# Patient Record
Sex: Female | Born: 1990 | Race: Black or African American | Hispanic: No | Marital: Single | Smoking: Former smoker
Health system: Southern US, Community
[De-identification: ages and names within clinical notes are randomized; demographics above are authoritative.]

## PROBLEM LIST (undated history)

## (undated) DIAGNOSIS — Z789 Other specified health status: Secondary | ICD-10-CM

## (undated) HISTORY — PX: NO PAST SURGERIES: SHX2092

---
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
## 2012-08-18 ENCOUNTER — Telehealth: Payer: Self-pay | Admitting: *Deleted

## 2012-08-18 NOTE — Telephone Encounter (Signed)
MFM in [place] appt 08/31/12 @ 7A.

## 2012-08-26 ENCOUNTER — Encounter: Admitting: Advanced Practice Midwife

## 2012-08-26 ENCOUNTER — Encounter: Admitting: Obstetrics & Gynecology

## 2013-03-25 DIAGNOSIS — Z113 Encounter for screening for infections with a predominantly sexual mode of transmission: Secondary | ICD-10-CM | POA: Insufficient documentation

## 2013-03-25 DIAGNOSIS — Z309 Encounter for contraceptive management, unspecified: Secondary | ICD-10-CM | POA: Insufficient documentation

## 2013-08-22 ENCOUNTER — Emergency Department (HOSPITAL_BASED_OUTPATIENT_CLINIC_OR_DEPARTMENT_OTHER)
Admission: EM | Admit: 2013-08-22 | Discharge: 2013-08-22 | Disposition: A | Discharge status: Home / Self Care | Attending: Emergency Medicine | Admitting: Emergency Medicine

## 2013-08-22 ENCOUNTER — Encounter (HOSPITAL_BASED_OUTPATIENT_CLINIC_OR_DEPARTMENT_OTHER): Payer: Self-pay | Admitting: Emergency Medicine

## 2013-08-22 DIAGNOSIS — F17200 Nicotine dependence, unspecified, uncomplicated: Secondary | ICD-10-CM | POA: Insufficient documentation

## 2013-08-22 DIAGNOSIS — J329 Chronic sinusitis, unspecified: Secondary | ICD-10-CM

## 2013-08-22 DIAGNOSIS — J069 Acute upper respiratory infection, unspecified: Secondary | ICD-10-CM | POA: Insufficient documentation

## 2013-08-22 DIAGNOSIS — IMO0001 Reserved for inherently not codable concepts without codable children: Secondary | ICD-10-CM | POA: Insufficient documentation

## 2013-08-22 DIAGNOSIS — Z79899 Other long term (current) drug therapy: Secondary | ICD-10-CM | POA: Insufficient documentation

## 2013-08-22 MED ORDER — IBUPROFEN 400 MG PO TABS: 400.0000 mg | ORAL_TABLET | Freq: Four times a day (QID) | ORAL | Status: DC | PRN

## 2013-08-22 MED ORDER — AMOXICILLIN 500 MG PO CAPS: 500.0000 mg | ORAL_CAPSULE | Freq: Three times a day (TID) | ORAL | Status: DC

## 2013-08-22 MED ORDER — OXYMETAZOLINE HCL 0.05 % NA SOLN: 1.0000 | Freq: Two times a day (BID) | NASAL | Status: DC

## 2013-08-22 MED ORDER — SALINE SPRAY 0.65 % NA SOLN: 1.0000 | NASAL | Status: DC | PRN

## 2013-08-22 NOTE — Discharge Instructions (Signed)
We saw you in the ER for the cough and body aches. We think what you have is a viral syndrome - the treatment for which is symptomatic relief only, and your body will fight the infection off in a few days. We are prescribing you some meds for pain and other symptoms.   Cool Mist Vaporizers Vaporizers may help relieve the symptoms of a cough and cold. They add moisture to the air, which helps mucus to become thinner and less sticky. This makes it easier to breathe and cough up secretions. Cool mist vaporizers do not cause serious burns like hot mist vaporizers ("steamers, humidifiers"). Vaporizers have not been proved to show they help with colds. You should not use a vaporizer if you are allergic to mold.  HOME CARE INSTRUCTIONS  Follow the package instructions for the vaporizer.  Do not use anything other than distilled water in the vaporizer.  Do not run the vaporizer all of the time. This can cause mold or bacteria to grow in the vaporizer.  Clean the vaporizer after each time it is used.  Clean and dry the vaporizer well before storing it.  Stop using the vaporizer if worsening respiratory symptoms develop. Document Released: 11/30/2003 Document Revised: 11/04/2012 Document Reviewed: 07/22/2012 [facility] Patient Information 2014 [place], [place].  Viral Infections A viral infection can be caused by different types of viruses.Most viral infections are not serious and resolve on their own. However, some infections may cause severe symptoms and may lead to further complications. SYMPTOMS Viruses can frequently cause:  Minor sore throat.  Aches and pains.  Headaches.  Runny nose.  Different types of rashes.  Watery eyes.  Tiredness.  Cough.  Loss of appetite.  Gastrointestinal infections, resulting in nausea, vomiting, and diarrhea. These symptoms do not respond to antibiotics because the infection is not caused by bacteria. However, you might catch a bacterial infection  following the viral infection. This is sometimes called a "superinfection." Symptoms of such a bacterial infection may include:  Worsening sore throat with pus and difficulty swallowing.  Swollen neck glands.  Chills and a high or persistent fever.  Severe headache.  Tenderness over the sinuses.  Persistent overall ill feeling (malaise), muscle aches, and tiredness (fatigue).  Persistent cough.  Yellow, green, or brown mucus production with coughing. HOME CARE INSTRUCTIONS   Only take over-the-counter or prescription medicines for pain, discomfort, diarrhea, or fever as directed by your caregiver.  Drink enough water and fluids to keep your urine clear or pale yellow. Sports drinks can provide valuable electrolytes, sugars, and hydration.  Get plenty of rest and maintain proper nutrition. Soups and broths with crackers or rice are fine. SEEK IMMEDIATE MEDICAL CARE IF:   You have severe headaches, shortness of breath, chest pain, neck pain, or an unusual rash.  You have uncontrolled vomiting, diarrhea, or you are unable to keep down fluids.  You or your child has an oral temperature above 102 F (38.9 C), not controlled by medicine.  Your baby is older than 3 months with a rectal temperature of 102 F (38.9 C) or higher.  Your baby is 38 months old or younger with a rectal temperature of 100.4 F (38 C) or higher. MAKE SURE YOU:   Understand these instructions.  Will watch your condition.  Will get help right away if you are not doing well or get worse. Document Released: 12/12/2004 Document Revised: 05/27/2011 Document Reviewed: 07/09/2010 [facility] Patient Information 2014 [place], [place]. Sinusitis Sinusitis is redness, soreness, and swelling (  inflammation) of the paranasal sinuses. Paranasal sinuses are air pockets within the bones of your face (beneath the eyes, the middle of the forehead, or above the eyes). In healthy paranasal sinuses, mucus is able to drain out,  and air is able to circulate through them by way of your nose. However, when your paranasal sinuses are inflamed, mucus and air can become trapped. This can allow bacteria and other germs to grow and cause infection. Sinusitis can develop quickly and last only a short time (acute) or continue over a long period (chronic). Sinusitis that lasts for more than 12 weeks is considered chronic.  CAUSES  Causes of sinusitis include:  Allergies.  Structural abnormalities, such as displacement of the cartilage that separates your nostrils (deviated septum), which can decrease the air flow through your nose and sinuses and affect sinus drainage.  Functional abnormalities, such as when the small hairs (cilia) that line your sinuses and help remove mucus do not work properly or are not present. SYMPTOMS  Symptoms of acute and chronic sinusitis are the same. The primary symptoms are pain and pressure around the affected sinuses. Other symptoms include:  Upper toothache.  Earache.  Headache.  Bad breath.  Decreased sense of smell and taste.  A cough, which worsens when you are lying flat.  Fatigue.  Fever.  Thick drainage from your nose, which often is green and may contain pus (purulent).  Swelling and warmth over the affected sinuses. DIAGNOSIS  Your caregiver will perform a physical exam. During the exam, your caregiver may:  Look in your nose for signs of abnormal growths in your nostrils (nasal polyps).  Tap over the affected sinus to check for signs of infection.  View the inside of your sinuses (endoscopy) with a special imaging device with a light attached (endoscope), which is inserted into your sinuses. If your caregiver suspects that you have chronic sinusitis, one or more of the following tests may be recommended:  Allergy tests.  Nasal culture A sample of mucus is taken from your nose and sent to a lab and screened for bacteria.  Nasal cytology A sample of mucus is taken  from your nose and examined by your caregiver to determine if your sinusitis is related to an allergy. TREATMENT  Most cases of acute sinusitis are related to a viral infection and will resolve on their own within 10 days. Sometimes medicines are prescribed to help relieve symptoms (pain medicine, decongestants, nasal steroid sprays, or saline sprays).  However, for sinusitis related to a bacterial infection, your caregiver will prescribe antibiotic medicines. These are medicines that will help kill the bacteria causing the infection.  Rarely, sinusitis is caused by a fungal infection. In theses cases, your caregiver will prescribe antifungal medicine. For some cases of chronic sinusitis, surgery is needed. Generally, these are cases in which sinusitis recurs more than 3 times per year, despite other treatments. HOME CARE INSTRUCTIONS   Drink plenty of water. Water helps thin the mucus so your sinuses can drain more easily.  Use a humidifier.  Inhale steam 3 to 4 times a day (for example, sit in the bathroom with the shower running).  Apply a warm, moist washcloth to your face 3 to 4 times a day, or as directed by your caregiver.  Use saline nasal sprays to help moisten and clean your sinuses.  Take over-the-counter or prescription medicines for pain, discomfort, or fever only as directed by your caregiver. SEEK IMMEDIATE MEDICAL CARE IF:  You have increasing  pain or severe headaches.  You have nausea, vomiting, or drowsiness.  You have swelling around your face.  You have vision problems.  You have a stiff neck.  You have difficulty breathing. MAKE SURE YOU:   Understand these instructions.  Will watch your condition.  Will get help right away if you are not doing well or get worse. Document Released: 03/04/2005 Document Revised: 05/27/2011 Document Reviewed: 03/19/2011 [facility] Patient Information 2014 [place], [place].

## 2013-08-22 NOTE — ED Provider Notes (Signed)
CSN: [id number]     Arrival date & time 08/22/13  1155 History   First MD Initiated Contact with Patient 08/22/13 1254     Chief Complaint  Patient presents with  . Cough     (Consider location/radiation/quality/duration/timing/severity/associated sxs/prior Treatment) HPI Comments: Pt isd having some uri lie sx with congestion, cough and sinus pain. Pt also has body aches. Symptoms started 1 week ago. Cough is producing mucus - yellowish.  No wheezing, no sick contacts. + chills x 2 days. Pt is using OTC meds for congestion.  Patient is a 23 y.o. female presenting with cough. The history is provided by the patient.  Cough Associated symptoms: chills, myalgias and rhinorrhea   Associated symptoms: no chest pain, no fever, no headaches, no shortness of breath, no sore throat and no wheezing     History reviewed. No pertinent past medical history. History reviewed. No pertinent past surgical history. No family history on file. History  Substance Use Topics  . Smoking status: Current Every Day Smoker  . Smokeless tobacco: Not on file  . Alcohol Use: Not on file   OB History   Grav Para Term Preterm Abortions TAB SAB Ect Mult Living                 Review of Systems  Constitutional: Positive for chills. Negative for fever and activity change.  HENT: Positive for congestion, postnasal drip, rhinorrhea, sinus pressure and voice change. Negative for sore throat.   Respiratory: Positive for cough. Negative for shortness of breath and wheezing.   Cardiovascular: Negative for chest pain.  Gastrointestinal: Negative for nausea, vomiting and abdominal pain.  Genitourinary: Negative for dysuria.  Musculoskeletal: Positive for myalgias. Negative for neck pain.  Neurological: Negative for headaches.      Allergies  Review of patient's allergies indicates no known allergies.  Home Medications   Prior to Admission medications   Medication Sig Start Date End Date Taking? Authorizing  Provider  amoxicillin (AMOXIL) 500 MG capsule Take 1 capsule (500 mg total) by mouth 3 (three) times daily. 08/29/13   Varney Biles, MD  ibuprofen (ADVIL,MOTRIN) 400 MG tablet Take 1 tablet (400 mg total) by mouth every 6 (six) hours as needed. 08/22/13   Varney Biles, MD  oxymetazoline (AFRIN NASAL SPRAY) 0.05 % nasal spray Place 1 spray into both nostrils 2 (two) times daily. 08/22/13   Varney Biles, MD  sodium chloride (OCEAN) 0.65 % SOLN nasal spray Place 1 spray into both nostrils as needed for congestion. 08/22/13   Shaune Westfall Kathrynn Humble, MD   BP 103/64  Pulse 91  Temp(Src) 98.6 F (37 C) (Oral)  Resp 18  Ht 5' (1.524 m)  Wt 113 lb (51.256 kg)  BMI 22.07 kg/m2  SpO2 100%  LMP 08/03/2013 Physical Exam  Nursing note and vitals reviewed. Constitutional: She is oriented to person, place, and time. She appears well-developed and well-nourished.  HENT:  Head: Normocephalic and atraumatic.  Eyes: EOM are normal. Pupils are equal, round, and reactive to light.  Neck: Neck supple.  Cardiovascular: Normal rate, regular rhythm and normal heart sounds.   No murmur heard. Pulmonary/Chest: Effort normal. No respiratory distress. She has no wheezes.  Abdominal: Soft. She exhibits no distension. There is no tenderness. There is no rebound and no guarding.  Lymphadenopathy:    She has cervical adenopathy.  Neurological: She is alert and oriented to person, place, and time.  Skin: Skin is warm and dry.    ED Course  Procedures (including  critical care time) Labs Review Labs Reviewed - No data to display  Imaging Review No results found.   EKG Interpretation None      MDM   Final diagnoses:  Viral URI  Sinusitis    DDX includes: Viral syndrome Influenza Pharyngitis Sinusitis  Viral URI per hx. Sx relief provided. [place] for wait and watch approach.  Varney Biles, MD 08/22/13 1324

## 2013-08-22 NOTE — ED Notes (Signed)
Patient here with frontal sinus pressure, dry cough, and body aches with chills x 1 week. Taking nasal spray with no relief

## 2013-10-10 ENCOUNTER — Emergency Department (HOSPITAL_BASED_OUTPATIENT_CLINIC_OR_DEPARTMENT_OTHER): Payer: TRICARE For Life (TFL)

## 2013-10-10 ENCOUNTER — Emergency Department (HOSPITAL_BASED_OUTPATIENT_CLINIC_OR_DEPARTMENT_OTHER)
Admission: EM | Admit: 2013-10-10 | Discharge: 2013-10-10 | Disposition: A | Discharge status: Home / Self Care | Payer: TRICARE For Life (TFL) | Attending: Emergency Medicine | Admitting: Emergency Medicine

## 2013-10-10 ENCOUNTER — Encounter (HOSPITAL_BASED_OUTPATIENT_CLINIC_OR_DEPARTMENT_OTHER): Payer: Self-pay | Admitting: Emergency Medicine

## 2013-10-10 DIAGNOSIS — N390 Urinary tract infection, site not specified: Secondary | ICD-10-CM | POA: Insufficient documentation

## 2013-10-10 DIAGNOSIS — Z792 Long term (current) use of antibiotics: Secondary | ICD-10-CM | POA: Insufficient documentation

## 2013-10-10 DIAGNOSIS — R197 Diarrhea, unspecified: Secondary | ICD-10-CM | POA: Insufficient documentation

## 2013-10-10 DIAGNOSIS — Z79899 Other long term (current) drug therapy: Secondary | ICD-10-CM | POA: Insufficient documentation

## 2013-10-10 DIAGNOSIS — F17200 Nicotine dependence, unspecified, uncomplicated: Secondary | ICD-10-CM | POA: Insufficient documentation

## 2013-10-10 DIAGNOSIS — R112 Nausea with vomiting, unspecified: Secondary | ICD-10-CM | POA: Insufficient documentation

## 2013-10-10 DIAGNOSIS — Z3202 Encounter for pregnancy test, result negative: Secondary | ICD-10-CM | POA: Insufficient documentation

## 2013-10-10 DIAGNOSIS — R1032 Left lower quadrant pain: Secondary | ICD-10-CM | POA: Insufficient documentation

## 2013-10-10 LAB — URINALYSIS, ROUTINE W REFLEX MICROSCOPIC
BILIRUBIN URINE: NEGATIVE
Glucose, UA: NEGATIVE mg/dL
Ketones, ur: 15 mg/dL — AB
Nitrite: NEGATIVE
PH: 8.5 — AB (ref 5.0–8.0)
Protein, ur: NEGATIVE mg/dL
SPECIFIC GRAVITY, URINE: 1.01 (ref 1.005–1.030)
UROBILINOGEN UA: 0.2 mg/dL (ref 0.0–1.0)

## 2013-10-10 LAB — PREGNANCY, URINE: Preg Test, Ur: NEGATIVE

## 2013-10-10 LAB — URINE MICROSCOPIC-ADD ON

## 2013-10-10 MED ORDER — CEPHALEXIN 500 MG PO CAPS: 500.0000 mg | ORAL_CAPSULE | Freq: Four times a day (QID) | ORAL | Status: DC

## 2013-10-10 MED ORDER — PHENAZOPYRIDINE HCL 200 MG PO TABS: 200.0000 mg | ORAL_TABLET | Freq: Three times a day (TID) | ORAL | Status: DC

## 2013-10-10 NOTE — ED Provider Notes (Signed)
Medical screening examination/treatment/procedure(s) were performed by non-physician practitioner and as supervising physician I was immediately available for consultation/collaboration.   EKG Interpretation None        Malvin Johns, MD 10/10/13 2256

## 2013-10-10 NOTE — Discharge Instructions (Signed)

## 2013-10-10 NOTE — ED Notes (Signed)
Pt presents to ED with complaints of rt lower abdominal pain for 2 days. Vomiting and diarrhea.

## 2013-10-10 NOTE — ED Provider Notes (Signed)
CSN: [id number]     Arrival date & time 10/10/13  1547 History   First MD Initiated Contact with Patient 10/10/13 1604     Chief Complaint  Patient presents with  . Abdominal Pain     (Consider location/radiation/quality/duration/timing/severity/associated sxs/prior Treatment) Patient is a 23 y.o. female presenting with abdominal pain. The history is provided by the patient and a parent. No language interpreter was used.  Abdominal Pain Pain location:  LLQ, RLQ and suprapubic Associated symptoms: diarrhea, nausea and vomiting   Associated symptoms: no chest pain, no cough, no dysuria, no fever and no vaginal discharge   Associated symptoms comment:  She is having lower abdominal pain that is waxing and waning, sharp at times, for the past 2 days. She reports not having a normal bowel movement during that time, only very watery movements multiple times during the day. She has had nausea and vomiting as well. No fever. No dysuria, vaginal bleeding or discharge. No bloody bowel movements or emesis.   History reviewed. No pertinent past medical history. History reviewed. No pertinent past surgical history. History reviewed. No pertinent family history. History  Substance Use Topics  . Smoking status: Current Every Day Smoker  . Smokeless tobacco: Not on file  . Alcohol Use: Not on file   OB History   Grav Para Term Preterm Abortions TAB SAB Ect Mult Living                 Review of Systems  Constitutional: Negative for fever.  Respiratory: Negative for cough.   Cardiovascular: Negative for chest pain.  Gastrointestinal: Positive for nausea, vomiting, abdominal pain and diarrhea. Negative for blood in stool.  Genitourinary: Negative for dysuria and vaginal discharge.  Musculoskeletal: Negative for myalgias.      Allergies  Review of patient's allergies indicates no known allergies.  Home Medications   Prior to Admission medications   Medication Sig Start Date End Date  Taking? Authorizing Provider  amoxicillin (AMOXIL) 500 MG capsule Take 1 capsule (500 mg total) by mouth 3 (three) times daily. 08/29/13   Varney Biles, MD  ibuprofen (ADVIL,MOTRIN) 400 MG tablet Take 1 tablet (400 mg total) by mouth every 6 (six) hours as needed. 08/22/13   Varney Biles, MD  oxymetazoline (AFRIN NASAL SPRAY) 0.05 % nasal spray Place 1 spray into both nostrils 2 (two) times daily. 08/22/13   Varney Biles, MD  sodium chloride (OCEAN) 0.65 % SOLN nasal spray Place 1 spray into both nostrils as needed for congestion. 08/22/13   Ankit Kathrynn Humble, MD   BP 104/68  Pulse 56  Temp(Src) 98.1 F (36.7 C) (Oral)  Resp 14  Ht 5\' 1"  (1.549 m)  Wt 105 lb (47.628 kg)  BMI 19.85 kg/m2  SpO2 100% Physical Exam  Constitutional: She is oriented to person, place, and time. She appears well-developed and well-nourished.  Neck: Normal range of motion.  Pulmonary/Chest: Effort normal.  Abdominal: Soft.  Bowel sounds hyperactive. Abdomen soft, minimally tender across lower abdomen and non-tender upper abdomen.  Musculoskeletal: Normal range of motion.  Neurological: She is alert and oriented to person, place, and time.  Skin: Skin is warm and dry.  Psychiatric: She has a normal mood and affect.    ED Course  Procedures (including critical care time) Labs Review Labs Reviewed  URINALYSIS, ROUTINE W REFLEX MICROSCOPIC  PREGNANCY, URINE   Results for orders placed during the hospital encounter of 10/10/13  URINALYSIS, ROUTINE W REFLEX MICROSCOPIC      Result Value  Ref Range   Color, Urine YELLOW  YELLOW   APPearance CLOUDY (*) CLEAR   Specific Gravity, Urine 1.010  1.005 - 1.030   pH 8.5 (*) 5.0 - 8.0   Glucose, UA NEGATIVE  NEGATIVE mg/dL   Hgb urine dipstick SMALL (*) NEGATIVE   Bilirubin Urine NEGATIVE  NEGATIVE   Ketones, ur 15 (*) NEGATIVE mg/dL   Protein, ur NEGATIVE  NEGATIVE mg/dL   Urobilinogen, UA 0.2  0.0 - 1.0 mg/dL   Nitrite NEGATIVE  NEGATIVE   Leukocytes, UA LARGE  (*) NEGATIVE  PREGNANCY, URINE      Result Value Ref Range   Preg Test, Ur NEGATIVE  NEGATIVE  URINE MICROSCOPIC-ADD ON      Result Value Ref Range   Squamous Epithelial / LPF RARE  RARE   WBC, UA TOO NUMEROUS TO COUNT  <3 WBC/hpf   RBC / HPF 11-20  <3 RBC/hpf   Bacteria, UA MANY (*) RARE   Dg Abd 2 Views  10/10/2013   CLINICAL DATA:  Right lower abdominal pain for the past 2 days. Vomiting and diarrhea.  EXAM: ABDOMEN - 2 VIEW  COMPARISON:  No priors.  FINDINGS: Gas and stool are seen scattered throughout the colon extending to the level of the rectum. No pathologic distension of small bowel is noted. No gross evidence of pneumoperitoneum. Numerous pelvic phleboliths.  IMPRESSION: 1.  Nonobstructive bowel gas pattern. 2. No pneumoperitoneum.   Electronically Signed   By: Vinnie Langton M.D.   On: 10/10/2013 17:51   Imaging Review No results found.   EKG Interpretation None      MDM   Final diagnoses:  None    1. UTI  Well appearing patient, no concern for sepsis in the setting of UTI with N, V. Will treat with abx and encourage PCP follow up prn.    Dewaine Oats, PA-C 10/10/13 1836

## 2013-10-10 NOTE — ED Notes (Signed)
Pt discharged to home with family. NAD.  

## 2014-03-18 NOTE — L&D Delivery Note (Signed)
  Katherine Mendez, Katherine Mendez [[id number]]  Delivery Note  Patient requested epidural.  Prior to insertion at 0915, baby B was noted to have deep variable decels to the 90s w 3 contractions.  Repeat cervical exam showed patient was 10/100/+2.  At this time, epidural was not placed.  Patient was transported to the operating room with double set up for attempted vaginal delivery.  Prior to transport, FHTs for baby B remained low at 110 (prior baseline 145).  NICU was alerted.    After arrival to the OR, reassuring FHTs for both baby A and B were confirmed at 145-150.  At this time, baby A had occ variable decels with contraction.  After assembly of the NICU and anesthesia teams, patient pushed well with two contractions.  At 9:35 AM a viable female was delivered via Vaginal, Spontaneous Delivery (Presentation: ; Occiput Anterior).  APGAR: 2, 7; weight 2 lb 11.7 oz (1240 g).  Baby A was passed to the waiting NICU team.  Placenta status: Intact, Manual removal.  Cord: 3 vessels with the following complications: None.  Cord pH: n/a   After delivery of baby A, I confirmed that baby B remained in the vertex position, both by vaginal exam and [place].  Baby Bs FHR was noted to slowly decrease from 145 to 120 to 100 in the minutes following delivery and did not return to its previous baseline.  I was concerned for possible placenta abruption following delivery of B.  Given the non-reassuring fetal heart tones, I attempted to expedite delivery with ROM despite the high fetal station to avoid cesarean delivery.  After ROM, the fetal hand and cord presented in front of the head.  The cord was reduced, but the hand remained the presenting part.  The fetal heart tones remained low at 100. Given the early gestational age, the decision was made to proceed with urgent cesarean delivery of baby B.  The patient was verbally consented and general anesthesia was induced.  Please see operative note for full details.        Anesthesia:  None  Episiotomy: None Lacerations: None Suture Repair: n/a Est. Blood Loss (mL):  600 mL  Mom to postpartum.  Baby to NICU.  [place] 12/30/2014, 1:27 PM     Katherine Mendez [[id number]]  Delivery Note At 9:46 AM a viable female was delivered via C-Section, Low Transverse (Presentation: ;  ).  APGAR: 5, 7; weight  2lb 13oz.   Placenta status: Intact, Manual removal.  Cord: 3 vessels with the following complications: Prolapsed.  Cord pH: unable to be collected  Mom to postpartum.  Baby to NICU.  [place] 12/30/2014, 1:27 PM

## 2014-08-01 DIAGNOSIS — O30039 Twin pregnancy, monochorionic/diamniotic, unspecified trimester: Secondary | ICD-10-CM | POA: Insufficient documentation

## 2014-09-06 ENCOUNTER — Other Ambulatory Visit (HOSPITAL_COMMUNITY): Payer: Self-pay | Admitting: Obstetrics & Gynecology

## 2014-09-06 DIAGNOSIS — O30032 Twin pregnancy, monochorionic/diamniotic, second trimester: Secondary | ICD-10-CM

## 2014-10-18 ENCOUNTER — Other Ambulatory Visit (HOSPITAL_COMMUNITY): Payer: Self-pay | Admitting: Obstetrics & Gynecology

## 2014-10-18 ENCOUNTER — Ambulatory Visit (HOSPITAL_COMMUNITY)
Admission: RE | Admit: 2014-10-18 | Discharge: 2014-10-18 | Disposition: A | Discharge status: Home / Self Care | Source: Ambulatory Visit | Attending: Obstetrics & Gynecology | Admitting: Obstetrics & Gynecology

## 2014-10-18 ENCOUNTER — Encounter (HOSPITAL_COMMUNITY): Payer: Self-pay

## 2014-10-18 DIAGNOSIS — O30032 Twin pregnancy, monochorionic/diamniotic, second trimester: Secondary | ICD-10-CM

## 2014-10-18 DIAGNOSIS — O26872 Cervical shortening, second trimester: Secondary | ICD-10-CM | POA: Insufficient documentation

## 2014-10-18 DIAGNOSIS — O26879 Cervical shortening, unspecified trimester: Secondary | ICD-10-CM

## 2014-10-18 DIAGNOSIS — O30002 Twin pregnancy, unspecified number of placenta and unspecified number of amniotic sacs, second trimester: Secondary | ICD-10-CM | POA: Diagnosis present

## 2014-10-18 DIAGNOSIS — Z3A18 18 weeks gestation of pregnancy: Secondary | ICD-10-CM | POA: Diagnosis not present

## 2014-10-18 NOTE — Consult Note (Signed)
Maternal Fetal Medicine Consultation  Requesting Provider(s): Sanjuana Kava, MD  Reason for consultation: MC/DA twin gestation  HPI: Katherine Mendez is a 24 yo G1P0, EDD 03/19/2015 currently at 18w 2d who was seen for consultation and co-management due to MC/DA twin gestation.  Her prenatal course thus far has otherwise been uncomplicated.  OB History: OB History    Gravida Para Term Preterm AB TAB SAB Ectopic Multiple Living   1 0 0 0 0 0 0 0 0 0       PMH: Neg  PSH: Neg  Meds:  PNV  Allergies: No Known Allergies  FH: Denies family history of birth defects or hereditary disorders  Soc: Denies tobacco or ETOH use.  Reports some rare Marijuana use.   Review of Systems: no vaginal bleeding or cramping/contractions, no LOF, no nausea/vomiting. All other systems reviewed and are negative.  PE:  121 lbs, 117/72, 20  GEN: well-appearing female ABD: gravid, NT  Ultrasound: MC/DA twin gestation at 17w 2d Fetal growth is concordant (1% inter-twin discordance noted)  Twin A: Cephalic presentation, anterior placenta Echogenic intracardiac focus noted (see comments) The remainder of the fetal anatomy is normal Normal amniotic fluid volume (MVP 5.4 cm)  Twin B: Cephalic presentation, anterior placenta Normal fetal anatomic survey Normal amniotic fluid volume (MVP 4.8 cm)  TVUS - cervical length 2.2 cm with some cervical debris noted.  Some funneling noted at the internal os.  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  We reviewed the risks of Twin-Twin transfusion syndrome (10-15% of MC/DA twins), selective IUGR due to discordant placental sharing, increased risk of birth defects (particularly congenital heart disease with MC/DA twins)  risk of preterm delivery, increased risk of preeclampsia.  On today's study, there is no evidence of TTTS.  2)  Shortened cervix - the cervix today measures 2.2 cm with some funneling at the internal os and some cervical debris.  Unfortunately, there are few interventions to offer in multiple gestation with shortened cervix.  There does not appear to be a role for cerclage, and vaginal progesterone as well as pessary to not appear to decrease the risk of preterm delivery in Twin gestation.  Recommendations: 1) While of no proven benefit, feel that the risks of vaginal progesterone supplementation are minimal.  After discussion, the patient elected to begin vaginal progesterone - a prescription was given.  The patient is tentatively scheduled to return in 2 weeks for reevaluation. 2) Recommend an assessment of amniotic fluid volume every 2 weeks - the patient is tentatively scheduled to return to our office in 2 weeks to TTTS screen in addition to cervical length. 3) Recommend serial growth scans every 4 weeks - we have also tentatively scheduled follow up in our office in 4 weeks for follow up growth 5) Fetal echo is scheduled due to increased risk of congenital heart defects in [facility] twins. 6) Antenatal testing (2x weekly NSTs with weekly AFIs) beginning no later that [redacted] weeks gestation 7) If undelivered and otherwise uncomplicated, would recommend delivery by [redacted] weeks gestation.   Thank you for the opportunity to be a part of the care of Katherine Mendez. Please contact our office if we can be of further assistance.   I spent approximately 30 minutes with this patient with over 50% of time spent in face-to-face counseling.  Benjaman Lobe, MD Maternal Fetal  Medicine

## 2014-10-19 ENCOUNTER — Other Ambulatory Visit (HOSPITAL_COMMUNITY): Payer: Self-pay | Admitting: Obstetrics & Gynecology

## 2014-11-03 ENCOUNTER — Encounter (HOSPITAL_COMMUNITY): Payer: Self-pay | Admitting: Obstetrics & Gynecology

## 2014-11-03 ENCOUNTER — Ambulatory Visit (HOSPITAL_COMMUNITY)
Admission: RE | Admit: 2014-11-03 | Discharge: 2014-11-03 | Disposition: A | Discharge status: Home / Self Care | Source: Ambulatory Visit | Attending: Obstetrics & Gynecology | Admitting: Obstetrics & Gynecology

## 2014-11-03 ENCOUNTER — Encounter (HOSPITAL_COMMUNITY): Payer: Self-pay

## 2014-11-03 DIAGNOSIS — O26879 Cervical shortening, unspecified trimester: Secondary | ICD-10-CM | POA: Insufficient documentation

## 2014-11-03 DIAGNOSIS — O30032 Twin pregnancy, monochorionic/diamniotic, second trimester: Secondary | ICD-10-CM | POA: Diagnosis not present

## 2014-11-03 HISTORY — DX: Other specified health status: Z78.9

## 2014-11-17 ENCOUNTER — Ambulatory Visit (HOSPITAL_COMMUNITY)
Admission: RE | Admit: 2014-11-17 | Discharge: 2014-11-17 | Disposition: A | Discharge status: Home / Self Care | Payer: Medicaid Other | Source: Ambulatory Visit | Attending: Obstetrics & Gynecology | Admitting: Obstetrics & Gynecology

## 2014-11-17 ENCOUNTER — Encounter (HOSPITAL_COMMUNITY): Payer: Self-pay

## 2014-11-17 DIAGNOSIS — O30032 Twin pregnancy, monochorionic/diamniotic, second trimester: Secondary | ICD-10-CM | POA: Diagnosis not present

## 2014-11-17 DIAGNOSIS — Z36 Encounter for antenatal screening of mother: Secondary | ICD-10-CM | POA: Insufficient documentation

## 2014-11-17 DIAGNOSIS — O26879 Cervical shortening, unspecified trimester: Secondary | ICD-10-CM

## 2014-11-17 NOTE — ED Notes (Signed)
Pt reports lower abd and back pressure.

## 2014-11-30 ENCOUNTER — Encounter (HOSPITAL_COMMUNITY): Payer: Self-pay | Admitting: *Deleted

## 2014-11-30 ENCOUNTER — Inpatient Hospital Stay (HOSPITAL_COMMUNITY)
Admission: AD | Admit: 2014-11-30 | Discharge: 2015-01-03 | DRG: 765 | Disposition: A | Discharge status: Home / Self Care | Payer: Medicaid Other | Source: Ambulatory Visit | Attending: Obstetrics | Admitting: Obstetrics

## 2014-11-30 DIAGNOSIS — Z3490 Encounter for supervision of normal pregnancy, unspecified, unspecified trimester: Secondary | ICD-10-CM

## 2014-11-30 DIAGNOSIS — O99214 Obesity complicating childbirth: Secondary | ICD-10-CM | POA: Diagnosis present

## 2014-11-30 DIAGNOSIS — O42919 Preterm premature rupture of membranes, unspecified as to length of time between rupture and onset of labor, unspecified trimester: Secondary | ICD-10-CM

## 2014-11-30 DIAGNOSIS — O30032 Twin pregnancy, monochorionic/diamniotic, second trimester: Secondary | ICD-10-CM

## 2014-11-30 DIAGNOSIS — F1290 Cannabis use, unspecified, uncomplicated: Secondary | ICD-10-CM | POA: Diagnosis present

## 2014-11-30 DIAGNOSIS — E669 Obesity, unspecified: Secondary | ICD-10-CM | POA: Diagnosis present

## 2014-11-30 DIAGNOSIS — O30033 Twin pregnancy, monochorionic/diamniotic, third trimester: Secondary | ICD-10-CM

## 2014-11-30 DIAGNOSIS — Z3A28 28 weeks gestation of pregnancy: Secondary | ICD-10-CM | POA: Diagnosis not present

## 2014-11-30 DIAGNOSIS — T81509A Unspecified complication of foreign body accidentally left in body following unspecified procedure, initial encounter: Secondary | ICD-10-CM

## 2014-11-30 DIAGNOSIS — O99324 Drug use complicating childbirth: Secondary | ICD-10-CM | POA: Diagnosis present

## 2014-11-30 DIAGNOSIS — O26879 Cervical shortening, unspecified trimester: Secondary | ICD-10-CM | POA: Diagnosis not present

## 2014-11-30 DIAGNOSIS — Z3A27 27 weeks gestation of pregnancy: Secondary | ICD-10-CM

## 2014-11-30 DIAGNOSIS — Z87891 Personal history of nicotine dependence: Secondary | ICD-10-CM

## 2014-11-30 DIAGNOSIS — O26872 Cervical shortening, second trimester: Secondary | ICD-10-CM

## 2014-11-30 DIAGNOSIS — Z6824 Body mass index (BMI) 24.0-24.9, adult: Secondary | ICD-10-CM | POA: Diagnosis not present

## 2014-11-30 DIAGNOSIS — O9902 Anemia complicating childbirth: Secondary | ICD-10-CM | POA: Diagnosis present

## 2014-11-30 DIAGNOSIS — O30009 Twin pregnancy, unspecified number of placenta and unspecified number of amniotic sacs, unspecified trimester: Secondary | ICD-10-CM

## 2014-11-30 DIAGNOSIS — Z3A26 26 weeks gestation of pregnancy: Secondary | ICD-10-CM

## 2014-11-30 DIAGNOSIS — O30003 Twin pregnancy, unspecified number of placenta and unspecified number of amniotic sacs, third trimester: Secondary | ICD-10-CM

## 2014-11-30 DIAGNOSIS — IMO0001 Reserved for inherently not codable concepts without codable children: Secondary | ICD-10-CM

## 2014-11-30 DIAGNOSIS — O26873 Cervical shortening, third trimester: Secondary | ICD-10-CM

## 2014-11-30 LAB — TYPE AND SCREEN
ABO/RH(D): O POS
ANTIBODY SCREEN: NEGATIVE

## 2014-11-30 LAB — CBC
HCT: 30 % — ABNORMAL LOW (ref 36.0–46.0)
HEMOGLOBIN: 10 g/dL — AB (ref 12.0–15.0)
MCH: 30.3 pg (ref 26.0–34.0)
MCHC: 33.3 g/dL (ref 30.0–36.0)
MCV: 90.9 fL (ref 78.0–100.0)
PLATELETS: 214 10*3/uL (ref 150–400)
RBC: 3.3 MIL/uL — ABNORMAL LOW (ref 3.87–5.11)
RDW: 13.3 % (ref 11.5–15.5)
WBC: 18 10*3/uL — ABNORMAL HIGH (ref 4.0–10.5)

## 2014-11-30 LAB — ABO/RH: ABO/RH(D): O POS

## 2014-11-30 MED ORDER — ACETAMINOPHEN 325 MG PO TABS: 650.0000 mg | ORAL_TABLET | ORAL | Status: DC | PRN

## 2014-11-30 MED ORDER — DOCUSATE SODIUM 100 MG PO CAPS
100.0000 mg | ORAL_CAPSULE | Freq: Every day | ORAL | Status: DC
  Administered 2014-12-01 – 2014-12-29 (×29): 100 mg via ORAL
  Filled 2014-11-30 (×29): qty 1

## 2014-11-30 MED ORDER — PRENATAL MULTIVITAMIN CH
1.0000 | ORAL_TABLET | Freq: Every day | ORAL | Status: DC
Start: 2014-11-30 — End: 2014-12-30
  Administered 2014-12-01 – 2014-12-29 (×29): 1 via ORAL
  Filled 2014-11-30 (×29): qty 1

## 2014-11-30 MED ORDER — ZOLPIDEM TARTRATE 5 MG PO TABS: 5.0000 mg | ORAL_TABLET | Freq: Every evening | ORAL | Status: DC | PRN

## 2014-11-30 MED ORDER — CALCIUM CARBONATE ANTACID 500 MG PO CHEW
2.0000 | CHEWABLE_TABLET | ORAL | Status: DC | PRN
  Administered 2014-12-27: 400 mg via ORAL
  Filled 2014-11-30: qty 2

## 2014-11-30 NOTE — H&P (Signed)
Pt is a 24 y/o black female, G1P0 at 66 1/2 weeks with a twin preg that is complicated by a short cx. This is a mono/di twin preg. She was first noted to have a short cx at 18 weeks(2.2cm). She had a consult with MFM and a pessary was placed. The pt has also been using PG in her vagina. She had a follow up u/s today in the office and no measurable cx was seen . On pelvic exam no membranes could be seen. Her cervix was difficult to palp. ? That the os is 1-2 cm but no fetal part palp.  PMHX: see [facility] PE: VSSAF        HEENT-wnl        Abd- gravid, no palp ctx        exts-wnl        FHTs- ok for 24 weeks IMP/ twin IUP with short cervix, continues to shorten         No cx seen on u/s, difficult to palp Plan/ Will admit for obs          MFM consult

## 2014-12-01 ENCOUNTER — Ambulatory Visit (HOSPITAL_COMMUNITY): Payer: Medicaid Other

## 2014-12-01 MED ORDER — LABETALOL HCL 100 MG PO TABS: 100.0000 mg | ORAL_TABLET | Freq: Once | ORAL | Status: DC

## 2014-12-01 MED ORDER — BETAMETHASONE SOD PHOS & ACET 6 (3-3) MG/ML IJ SUSP
12.0000 mg | Freq: Once | INTRAMUSCULAR | Status: AC
  Administered 2014-12-01: 12 mg via INTRAMUSCULAR
  Filled 2014-12-01: qty 2

## 2014-12-01 NOTE — Progress Notes (Signed)
24 y.o. G1P0000 [redacted]w[redacted]d HD#1 admitted for Twins with short cervix.  Pt currently stable with no c/o.  Good FM.  Filed Vitals:   11/30/14 1416 11/30/14 1429 11/30/14 1714 11/30/14 2004  BP:  112/57 105/61 116/56  Pulse:  75 85 87  Temp:  98.3 F (36.8 C) 98.5 F (36.9 C) 98.5 F (36.9 C)  TempSrc:  Oral Oral Oral  Resp:  20 20 18   Height:  5\' 1"  (1.549 m)    Weight: 59.149 kg (130 lb 6.4 oz) 59.149 kg (130 lb 6.4 oz)      Lungs CTA Cor RRR Abd  Soft, gravid, nontender Ex SCDs FHTs  A and B in 1140-150s last night, no decels, good short term variability Toco  Irreg, may be irritability  Results for orders placed or performed during the hospital encounter of 11/30/14 (from the past 24 hour(s))  Type and screen     Status: None   Collection Time: 11/30/14  3:20 PM  Result Value Ref Range   ABO/RH(D) O POS    Antibody Screen NEG    Sample Expiration 12/03/2014   CBC     Status: Abnormal   Collection Time: 11/30/14  3:20 PM  Result Value Ref Range   WBC 18.0 (H) 4.0 - 10.5 K/uL   RBC 3.30 (L) 3.87 - 5.11 MIL/uL   Hemoglobin 10.0 (L) 12.0 - 15.0 g/dL   HCT 30.0 (L) 36.0 - 46.0 %   MCV 90.9 78.0 - 100.0 fL   MCH 30.3 26.0 - 34.0 pg   MCHC 33.3 30.0 - 36.0 g/dL   RDW 13.3 11.5 - 15.5 %   Platelets 214 150 - 400 K/uL  ABO/Rh     Status: None   Collection Time: 11/30/14  3:20 PM  Result Value Ref Range   ABO/RH(D) O POS     A:  HD#1  [redacted]w[redacted]d with mono/di twins with shortened cervix, pessary in place, receiving vaginal progesterone.  P: Will give BMZ and await further recommendations from MFM.  Obs with continuous toco for now.    Courtnei Ruddell A

## 2014-12-01 NOTE — Consult Note (Signed)
MATERNAL FETAL MEDICINE CONSULT  Patient Name: Katherine Mendez Medical Record Number:  [id number] Date of Birth: 1990/11/26 Requesting Physician Name:  Olga Millers, MD Date of Service: 12/01/2014  Chief Complaint Cervical insufficiency in twin pregnancy  History of Present Illness Katherine Mendez was seen today secondary to cervical insufficiency in a mono-di twin pregnancy at the request of Olga Millers, MD.  The patient is a 24 y.o. G1P0000,at [redacted]w[redacted]d with an EDD of 03/19/2015, by Last Menstrual Period dating method.  Ms. Grandmaison has been followed closely in the [facility] for twin-twin transfusion and cervical length surveillance.  Thus, far there has been no significant concerns about twin-twin transfusion, but she was noted to have progressive cervical shortening and had a pessary placed as a result.  Today she was seen in her primary OBs office and her cervix was found to be approximately 1 cm dilated with no residual cervical length seen on transvaginal ultrasound.  She has persistent lower abdominal/supra-pubic pain and cramping that has not changed acutely in recent days.  Fetuses are active.  She denies vaginal bleeding, loss of fluid, or contractions.  Review of Systems Pertinent items are noted in HPI.  Patient History OB History  Gravida Para Term Preterm AB SAB TAB Ectopic Multiple Living  1 0 0 0 0 0 0 0 0 0     # Outcome Date GA Lbr Len/2nd Weight Sex Delivery Anes PTL Lv  1 Current               Past Medical History  Diagnosis Date  . Medical history non-contributory     Past Surgical History  Procedure Laterality Date  . No past surgeries      Social History   Social History  . Marital Status: Single    Spouse Name: N/A  . Number of Children: N/A  . Years of Education: N/A   Social History Main Topics  . Smoking status: Former Research scientist (life sciences)  . Smokeless tobacco: None  . Alcohol Use: No  . Drug Use: Yes    Special: Marijuana     Comment: none with pregnancy  .  Sexual Activity: Not Asked   Other Topics Concern  . None   Social History Narrative    Family History  Problem Relation Age of Onset  . Depression Mother   . Hypertension Maternal Grandfather   . Hypertension Paternal Grandmother    In addition, the patient has no family history of mental retardation, birth defects, or genetic diseases.  Physical Examination Filed Vitals:   12/01/14 1201  BP: 105/62  Pulse: 94  Temp: 98.5 F (36.9 C)  Resp: 18   General appearance - alert, well appearing, and in no distress Abdomen - soft, nontender, nondistended, no masses or organomegaly Extremities - no pedal edema noted  Assessment and Recommendations 1.  Shortened cervix/cervical insufficiency.  The continued decrease in Ms. Guereca cervical length is very concerning for early delivery particularly considering her twin gestation.  Unfortunately, at this time there are no further intervention with proven efficacy in the situation.  I would repeat cervical length tomorrow in the [facility] and continue inpatient observation given the very early gestational age.  The duration of her hospitalization is difficult to predict at this time, but I recommend she remain in house until 28 weeks, at which time outpatient management can be reconsidered.  I spent 20 minutes with Ms. Kreiser today of which 50% was face-to-face counseling.  Thank you for referring Ms. Tousley to  the [place].  Please do not hesitate to contact us with questions.   Jolyn Lent, MD

## 2014-12-01 NOTE — Progress Notes (Signed)
See Dr.Nitsche's note- repeat cervical length tomorrow.  Expect in hospital observation until 28 weeks.

## 2014-12-02 ENCOUNTER — Ambulatory Visit (HOSPITAL_COMMUNITY)
Admission: RE | Admit: 2014-12-02 | Discharge: 2014-12-02 | Disposition: A | Discharge status: Home / Self Care | Payer: Medicaid Other | Source: Ambulatory Visit | Attending: Obstetrics & Gynecology | Admitting: Obstetrics & Gynecology

## 2014-12-02 DIAGNOSIS — O26879 Cervical shortening, unspecified trimester: Secondary | ICD-10-CM

## 2014-12-02 DIAGNOSIS — O30032 Twin pregnancy, monochorionic/diamniotic, second trimester: Secondary | ICD-10-CM | POA: Diagnosis not present

## 2014-12-02 MED ORDER — BETAMETHASONE SOD PHOS & ACET 6 (3-3) MG/ML IJ SUSP
12.0000 mg | Freq: Once | INTRAMUSCULAR | Status: AC
  Administered 2014-12-02: 12 mg via INTRAMUSCULAR
  Filled 2014-12-02: qty 2

## 2014-12-02 MED ORDER — MENTHOL 3 MG MT LOZG
1.0000 | LOZENGE | OROMUCOSAL | Status: DC | PRN
  Filled 2014-12-02: qty 9

## 2014-12-02 MED ORDER — SALINE SPRAY 0.65 % NA SOLN
1.0000 | NASAL | Status: DC | PRN
  Administered 2014-12-02: 1 via NASAL
  Filled 2014-12-02: qty 44

## 2014-12-02 NOTE — Progress Notes (Signed)
HD #3 mono/di twins with short cervix  Pt denies any complaints.  No contractions, LOF or bleeding.  Good FM.   Filed Vitals:   12/01/14 1959 12/02/14 0609 12/02/14 0936 12/02/14 1238  BP: 117/61 122/66 120/74 103/51  Pulse: 92 110 91 102  Temp: 97.8 F (36.6 C) 98.3 F (36.8 C) 98.4 F (36.9 C) 98.2 F (36.8 C)  TempSrc: Oral  Oral Oral  Resp: 20 16 18 20   Height:      Weight:       Gen: NAD, sitting comfortable Abd: gravid, NT Ext: no CT  Lab Results  Component Value Date   WBC 18.0* 11/30/2014   HGB 10.0* 11/30/2014   HCT 30.0* 11/30/2014   MCV 90.9 11/30/2014   PLT 214 11/30/2014    --/--/O POS, O POS (09/14 1520)  HD#3 mono/di twins with short cervix Appreciate MFM consultation recommendations [place] by MFM today showing no residual cervix and funneling of membranes into vagina, ceph/ceph S/p beta x 2 Continuous toco with FHT qshift Advised pt bed rest with BR priviledges Will continue inpatient monitoring.   Allyn Kenner

## 2014-12-03 NOTE — Progress Notes (Signed)
HD#4 Mono/di twins with short cervix.  No complaints.  Filed Vitals:   12/02/14 1940 12/02/14 2100 12/03/14 0618 12/03/14 0827  BP: 118/67 116/70 114/63 108/55  Pulse: 98 99 89 91  Temp: 98 F (36.7 C) 98.1 F (36.7 C) 98 F (36.7 C) 98.6 F (37 C)  TempSrc: Oral Oral Oral   Resp: 18 18 18 16   Height:      Weight:        Abd: gravid/NT FHT: present x 2 TOCO: no ctx SVE: deferred  Lab Results  Component Value Date   WBC 18.0* 11/30/2014   HGB 10.0* 11/30/2014   HCT 30.0* 11/30/2014   MCV 90.9 11/30/2014   PLT 214 11/30/2014    --/--/O POS, O POS (09/14 1520)  A/P HD#4 mono/di twins short cervix Stable and unchanged from yesterday Continue inpatient monitoring and other routine care  [place], [facility]

## 2014-12-04 MED ORDER — VITAMINS A & D EX OINT
TOPICAL_OINTMENT | CUTANEOUS | Status: DC | PRN
  Filled 2014-12-04: qty 5

## 2014-12-04 NOTE — Progress Notes (Addendum)
HD#5 mono/di twins with short cervix.  No fever, LOF, vaginal bleeding, ctx.  Good FM x 2.  Filed Vitals:   12/03/14 0618 12/03/14 0827 12/03/14 1934 12/03/14 2142  BP: 114/63 108/55 104/64 117/61  Pulse: 89 91 91 82  Temp: 98 F (36.7 C) 98.6 F (37 C) 98.6 F (37 C) 98.4 F (36.9 C)  TempSrc: Oral  Oral Oral  Resp: 18 16 18 18   Height:      Weight:        Abd; gravid, soft, NT Ext; no CT FHT: present in normal range x 2 this morning TOCO: no ctx, occ. Uterine irritability  Lab Results  Component Value Date   WBC 18.0* 11/30/2014   HGB 10.0* 11/30/2014   HCT 30.0* 11/30/2014   MCV 90.9 11/30/2014   PLT 214 11/30/2014    --/--/O POS, O POS (09/14 1520)  A/P 25.0 mono/di twins with short cervix Appreciate MFM consultation recommendations, serial [place] by MFM today showing no residual cervix and funneling of membranes into vagina, ceph/ceph S/p beta x 2 Continuous toco with FHT qshift Advised pt bed rest with BR priviledges Will continue inpatient monitoring.  SW consult pending.  Allyn Kenner

## 2014-12-04 NOTE — Progress Notes (Signed)
Acknowledged order for social work consult regarding family issues.    Met with patient who was very pleasant and receptive to CSW.   Patient states that she is [redacted] weeks pregnant with twin boys.   She has no other dependents.    Informed that she has been stressed because her relationship with FOB changed once she became pregnant, and he has been completely unsupportive of the pregnancy.  And has made it clear that he was not ready to have children.   Patient states that his lack of interest and support of the pregnancy has been very hurtful.  Provided extensive support and allowed her to vent.  She's unsure of whether his family is aware of the pregnancy.  She recently graduated from [employer] and states that FOB is scheduled to graduate this December.  He's also questioning paternity.   Patient is very articulate and intone with her feelings.  She reports extensive family support, but longs for the support of FOB.  Discussed various coping strategies and ways to approach FOB in the future.  Patient is connected with resources.  She has Medicaid, receives [facility] and food stamps and is involved with the [facility] program.   Provided extensive support and encouragement.  Patient informed of CSW availability.   CSW will continue to follow for support PRN.

## 2014-12-05 ENCOUNTER — Encounter (HOSPITAL_COMMUNITY): Payer: Self-pay | Admitting: *Deleted

## 2014-12-05 MED ORDER — HYDROCORTISONE 1 % EX CREA
TOPICAL_CREAM | Freq: Two times a day (BID) | CUTANEOUS | Status: DC
  Administered 2014-12-05: 1 via TOPICAL
  Administered 2014-12-06 – 2014-12-15 (×10): via TOPICAL
  Filled 2014-12-05: qty 28

## 2014-12-05 NOTE — Progress Notes (Signed)
Introduced spiritual care services to patient who states she feels like she is coping well and understands she'll need to be in the hospital until she has her babies.  Pt was on hold with a call to "get some business taken care of".  Chaplain told patient how to get in touch and will follow up later this week.  Karsten Ro [place] Chaplain   12/05/14 1530  Clinical Encounter Type  Visited With Patient  Visit Type Initial

## 2014-12-05 NOTE — Progress Notes (Signed)
HD#6 mono/di twins with short cervix.  No fever, LOF, vaginal bleeding, ctx.  Good FM x 2.  Filed Vitals:   12/04/14 0807 12/04/14 1948 12/04/14 2338 12/05/14 0844  BP: 116/61 118/56 113/56 110/58  Pulse: 94 94 88 88  Temp: 98.5 F (36.9 C) 97.8 F (36.6 C) 98.4 F (36.9 C) 98.6 F (37 C)  TempSrc:  Oral Oral   Resp: 16 16 16 16   Height:      Weight:        Abd; gravid, soft, NT Ext; no CT FHT: present in normal range x 2 this morning TOCO: quiet, occ ctx  Lab Results  Component Value Date   WBC 18.0* 11/30/2014   HGB 10.0* 11/30/2014   HCT 30.0* 11/30/2014   MCV 90.9 11/30/2014   PLT 214 11/30/2014    --/--/O POS, O POS (09/14 1520)  A/P [redacted]w[redacted]d mono/di twins with short cervix Appreciate MFM consultation recommendations, serial [place] by MFM today showing no residual cervix and funneling of membranes into vagina, ceph/ceph Growth 9/1-A 543 g (56%), B 562 g (59%) S/p beta x 2 on 9/15-16 Continuous toco with FHT qshift Advised pt bed rest with BR priviledges Will continue inpatient monitoring.    [place]

## 2014-12-06 LAB — TYPE AND SCREEN
ABO/RH(D): O POS
ANTIBODY SCREEN: NEGATIVE

## 2014-12-06 NOTE — Consult Note (Signed)
[facility]  Prenatal Consult       12/06/2014  1:12 PM   I was asked by Dr. Philis Pique to consult on this patient for possible preterm delivery.  I had the pleasure of meeting with Mrs.  today.  She is a 24 y/o G1P0 mother with 66 and 2/7 week mo-di twins.  Her pregnancy has been complicated by short cervix and she has a pessary in pace.  Her last U/S on 9/16 showed bulging membranes.  She will remain hospitalized until she delivers.  She received BMZ on 9/15 and 9/16.   I explained that we would universally recommend resuscitating a typical infant >[redacted] weeks gestation because the chance of survival with a good outcome is so high. I explained that the neonatal intensive care team would be present for the delivery and outlined the likely delivery room course for this baby including routine resuscitation and NRP-guided approaches to the treatment of respiratory distress. We discussed other common problems associated with prematurity including respiratory distress syndrome/CLD, apnea, feeding issues, temperature regulation, and infection risk.  We briefly discussed IVH/PVL, ROP, and NEC and that these are complications associated with prematurity, and that by 30 weeks are uncommon.    We discussed the average length of stay but I noted that the actual LOS would depend on the severity of problems encountered and response to treatments.  We discussed visitation policies and the resources available while her child is in the hospital.  We discussed the importance of good nutrition and various methods of providing nutrition (parenteral hyperalimentation, gavage feedings and/or oral feeding). We discussed the benefits of human milk. I encouraged breast feeding and pumping soon after birth and outlined resources that are available to support breast feeding.  She does intend to breast feed  Thank you for involving [place] in the care of this patient. A member of our team will be available should the  family have additional questions.  Time for consultation approximately 45 minutes.   _____________________ Electronically Signed By: Clinton Gallant, MD Neonatologist

## 2014-12-06 NOTE — Progress Notes (Signed)
24 y.o. G1P0000 [redacted]w[redacted]d HD#6 admitted for twins, mono/di with short cervix.  Pt currently stable with no c/o.  Good FM.  Filed Vitals:   12/05/14 2341  BP: 118/60  Pulse: 94  Temp: 98.6 F (37 C)  Resp: 16     Lungs CTA Cor RRR Abd  Soft, gravid, nontender Ex SCDs FHTs  A 150s, B150s, good short term variability, NST R x2 Toco  occ  No results found for this or any previous visit (from the past 24 hour(s)).  A:  HD#6  [redacted]w[redacted]d with Twins, mono/di, short cervix and pessary in place. Last [place] om 9-16 showed cervix cm with bulging membranes; vtx/vtx.  P: S/p BMZ.  Bed rest and continued observation with toco.  NICU consult today.  HORVATH,MICHELLE A

## 2014-12-07 NOTE — Progress Notes (Signed)
HD#8 mono/di twins with short cervix.  No fever, LOF, vaginal bleeding, ctx, pressure.  Good FM x 2.  Filed Vitals:   12/06/14 1202 12/06/14 1550 12/06/14 2049 12/07/14 0723  BP: 110/88 132/70 113/68 117/52  Pulse: 70 104 89 73  Temp: 98 F (36.7 C) 97.2 F (36.2 C) 98.1 F (36.7 C) 98.5 F (36.9 C)  TempSrc: Oral Oral Oral Oral  Resp:   18 18  Height:      Weight:      SpO2:  100%      Abd; gravid, soft, NT Ext; no CT FHT: present in normal range x 2 this morning TOCO: quiet, occ ctx  Lab Results  Component Value Date   WBC 18.0* 11/30/2014   HGB 10.0* 11/30/2014   HCT 30.0* 11/30/2014   MCV 90.9 11/30/2014   PLT 214 11/30/2014    --/--/O POS (09/20 1315)  A/P [redacted]w[redacted]d mono/di twins with short cervix  [place] by MFM today showing no residual cervix and funneling of membranes into vagina, ceph/ceph Growth 9/1-A 543 g (56%), B 562 g (59%) S/p beta x 2 on 9/15-16 Continuous toco with FHT qshift Advised pt bed rest with BR priviledges Will continue inpatient monitoring.  S/p NICU consult  Asymptomatic today, requires continued inpatient management at this time.    [place]

## 2014-12-07 NOTE — Progress Notes (Signed)
Initial Nutrition Assessment  DOCUMENTATION CODES:   Not applicable  INTERVENTION:  Regular diet May order double protein portions, snacks TID and from retail   NUTRITION DIAGNOSIS:   Increased nutrient needs related to  (twin pregnancy) as evidenced by  ([redacted] weeks pregnant).   GOAL:   Patient will meet greater than or equal to 90% of their needs   MONITOR:   Weight trends  REASON FOR ASSESSMENT:   Antenatal    ASSESSMENT:   25 3/7 weeks, twins, short cervix. Pre-pregnancy weight 105 lbs, BMI 19.9.  25 lb weight gain, weight gain goals 35-45 lbs. diet tolerated well, appetitie good   Diet Order:  Diet regular Room service appropriate?: Yes; Fluid consistency:: Thin  Skin:  Reviewed, no issues  Height:   Ht Readings from Last 1 Encounters:  11/30/14 5\' 1"  (1.549 m)    Weight:   Wt Readings from Last 1 Encounters:  12/07/14 137 lb (62.143 kg)    Ideal Body Weight:    105 lbs  BMI:  Body mass index is 25.9 kg/(m^2).  Estimated Nutritional Needs:   Kcal:  1950-2150  Protein:  97-107 g  Fluid:  2.3 L  EDUCATION NEEDS:   No education needs identified at this time  Cathlean Sauer.Fredderick Severance LDN Neonatal Nutrition Support Specialist/RD III Pager [phone]      Phone [phone]

## 2014-12-08 NOTE — Progress Notes (Signed)
HD#9 mono/di twins with no measurable cervix. No complaints, denies any changes.  No fever, LOF, vaginal bleeding, ctx, pressure.  Good FM x 2.  Filed Vitals:   12/07/14 1244 12/07/14 1537 12/07/14 1956 12/07/14 2135  BP: 125/74 112/66 135/64 120/63  Pulse: 92 99 80 102  Temp: 98.8 F (37.1 C) 98.4 F (36.9 C) 98.3 F (36.8 C)   TempSrc: Oral Oral Oral   Resp: 18 20 18    Height:      Weight:      SpO2:        Abd; gravid, soft, NT Ext; no CT FHT: present in normal range x 2 this morning TOCO: quiet, occ ctx  Lab Results  Component Value Date   WBC 18.0* 11/30/2014   HGB 10.0* 11/30/2014   HCT 30.0* 11/30/2014   MCV 90.9 11/30/2014   PLT 214 11/30/2014    --/--/O POS (09/20 1315)  A/P [redacted]w[redacted]d mono/di twins with short cervix  1) [place] by MFM today showing no residual cervix and funneling of membranes into vagina, ceph/ceph. Pessary in place. Continue vaginal progesterone - Growth 9/1-A 543 g (56%), B 562 g (59%) 2) S/p beta x 2 on 9/15-16 3) Continuous toco with FHT qshift 4) Advised pt bed rest with BR priviledges 5) Will continue inpatient monitoring until at least 28 weeks per MFM recommendations 6) S/p NICU consult  Asymptomatic today, requires continued inpatient management at this time.    ROSS,KENDRA H.

## 2014-12-09 LAB — TYPE AND SCREEN
ABO/RH(D): O POS
Antibody Screen: NEGATIVE

## 2014-12-09 NOTE — Progress Notes (Addendum)
24 y.o. G1P0000 [redacted]w[redacted]d HD#9 admitted for twins with no measurable cervix.  Pt currently stable with no c/o today.  Good FM.  Filed Vitals:   12/08/14 1219 12/08/14 1611 12/08/14 1940 12/08/14 2355  BP: 110/55 112/67 108/62 95/52  Pulse: 113 101 93 78  Temp: 98.7 F (37.1 C) 98.9 F (37.2 C) 98.3 F (36.8 C)   TempSrc: Oral Oral Oral   Resp: 16 16    Height:      Weight:      SpO2:        Lungs CTA Cor RRR Abd  Soft, gravid, nontender Ex SCDs FHTs  Last night, FHTS x2 150s, good short term variability Toco  Random contractions  No results found for this or any previous visit (from the past 24 hour(s)).  A:  HD#9  [redacted]w[redacted]d with twins, no measurable cervix, pessary in place.  P: 1) [place] by MFM  showing no residual cervix and funneling of membranes into vagina, ceph/ceph. Pessary in place. Continue vaginal progesterone - Growth 9/1-A 543 g (56%), B 562 g (59%) 2) S/p beta x 2 on 9/15-16 3) Continuous toco with FHT qshift 4) Advised pt bed rest with BR priviledges 5) Will continue inpatient monitoring until at least 28 weeks per MFM recommendations 6) S/p NICU consult 7)  1 hour GTTin two days at 26 weeks.  Asymptomatic today, requires continued inpatient management at this time.     HORVATH,MICHELLE A

## 2014-12-10 NOTE — Progress Notes (Signed)
24 y.o. G1P0000 [redacted]w[redacted]d HD#10 admitted for twins, no measurable cervix.  Pt currently stable with no c/o.  Good FM.  Filed Vitals:   12/08/14 1940 12/09/14 0816 12/09/14 1121 12/09/14 2057  BP:  107/63 120/68 119/62  Pulse:  85 88 95  Temp: 98.3 F (36.8 C) 98.6 F (37 C)  98.7 F (37.1 C)  TempSrc: Oral Oral  Oral  Resp:  16  18  Height:      Weight:      SpO2:  100% 97%     Lungs CTA Cor RRR Abd  Soft, gravid, nontender Ex SCDs FHTs  A/B140/150 by doppler Toco  Irregular last night and some irritability this am; now rare.  No results found for this or any previous visit (from the past 24 hour(s)).  A:  HD#10  [redacted]w[redacted]d with twins with no measurable cervix with pessary in place.  P: P: 1) [place] by MFM showing no residual cervix and funneling of membranes into vagina, ceph/ceph. Pessary in place. Continue vaginal progesterone - Growth 9/1-A 543 g (56%), B 562 g (59%); will get growth this coming week- September 29 - will be 4 weeks from last. 2) S/p beta x 2 on 9/15-16 3) Continuous toco with FHT qshift 4) Advised pt bed rest with BR priviledges 5) Will continue inpatient monitoring until at least 28 weeks per MFM recommendations 6) S/p NICU consult 7) 1 hour GTTin tomorrow at 26 weeks.  Asymptomatic today, requires continued inpatient management at this time.    HORVATH,MICHELLE A

## 2014-12-11 LAB — GLUCOSE TOLERANCE, 1 HOUR: GLUCOSE 1 HOUR GTT: 146 mg/dL — AB (ref 70–140)

## 2014-12-11 NOTE — Progress Notes (Signed)
24 y.o. G1P0000 [redacted]w[redacted]d HD#11 admitted for twins with no measurable cervix and pessary in place.  Pt currently stable with no c/o today.  Good FM.  Filed Vitals:   12/10/14 0900 12/10/14 1302 12/10/14 1700 12/10/14 2057  BP: 118/64 120/70 109/68 110/61  Pulse: 84 92 102 95  Temp: 98.1 F (36.7 C) 98.1 F (36.7 C) 98.2 F (36.8 C) 99 F (37.2 C)  TempSrc: Oral Oral Oral Oral  Resp: 18 18 16 16   Height:      Weight:      SpO2:        Lungs CTA Cor RRR Abd  Soft, gravid, nontender Ex SCDs FHTs  Present last night. Toco  Irritability with occ contraction  No results found for this or any previous visit (from the past 24 hour(s)).  A:  HD#11  [redacted]w[redacted]d with twins with no measurable cervix and pessary in place..  P: 1) [place] by MFM showing no residual cervix and funneling of membranes into vagina, ceph/ceph. Pessary in place. Continue vaginal progesterone - Growth 9/1-A 543 g (56%), B 562 g (59%); will get growth this coming week- September 29 - will be 4 weeks from last. 2) S/p beta x 2 on 9/15-16 3) Continuous toco with FHT qshift 4) Advised pt bed rest with BR priviledges 5) Will continue inpatient monitoring until at least 28 weeks per MFM recommendations 6) S/p NICU consult 7) 1 hour GTTin today at 26 weeks.  Asymptomatic today, requires continued inpatient management at this time.     HORVATH,MICHELLE A

## 2014-12-12 LAB — GLUCOSE, 1 HOUR GESTATIONAL: Glucose Tolerance, 1 hour: 127 mg/dL (ref 70–189)

## 2014-12-12 LAB — GLUCOSE, 2 HOUR GESTATIONAL: GLUCOSE, 2 HOUR-GESTATIONAL: 119 mg/dL (ref 70–164)

## 2014-12-12 LAB — TYPE AND SCREEN
ABO/RH(D): O POS
Antibody Screen: NEGATIVE

## 2014-12-12 LAB — GLUCOSE, FASTING GESTATIONAL: GLUCOSE, FASTING-GESTATIONAL: 93 mg/dL

## 2014-12-12 LAB — GLUCOSE, 3 HOUR GESTATIONAL: GLUCOSE 3 HOUR GTT: 91 mg/dL (ref 70–144)

## 2014-12-12 NOTE — Progress Notes (Signed)
24 y.o. G1P0000 [redacted]w[redacted]d HD#12 admitted for twins with no measurable cervix and pessary in place.  Pt currently stable with no c/o today.  Good FM. Denies bleeding or leaking  Filed Vitals:   12/12/14 1020 12/12/14 1229 12/12/14 1622 12/12/14 1942  BP: 109/63 119/62 109/90 131/68  Pulse: 110 81 94 112  Temp: 97.2 F (36.2 C) 98 F (36.7 C) 98.7 F (37.1 C) 98.4 F (36.9 C)  TempSrc: Axillary Oral Oral Oral  Resp: 18 18 18 18   Height:      Weight:      SpO2:        Lungs CTA Cor RRR Abd  Soft, gravid, nontender Ex SCDs FHTs  Present last night. Toco  Irritability with occ contraction  Results for orders placed or performed during the hospital encounter of 11/30/14 (from the past 24 hour(s))  Glucose, fasting gestational     Status: None   Collection Time: 12/12/14  5:10 AM  Result Value Ref Range   Glucose, Fasting-Gestational 93 mg/dL  Type and screen     Status: None   Collection Time: 12/12/14  7:25 AM  Result Value Ref Range   ABO/RH(D) O POS    Antibody Screen NEG    Sample Expiration 12/15/2014   Glucose, 1 hour gestational     Status: None   Collection Time: 12/12/14  7:25 AM  Result Value Ref Range   Glucose, 1 Hour-Gestational 127 70 - 189 mg/dL  Glucose, 2 hour gestational     Status: None   Collection Time: 12/12/14  8:15 AM  Result Value Ref Range   Glucose, 2 Hour-Gestational 119 70 - 164 mg/dL  Glucose, 3 hour gestational     Status: None   Collection Time: 12/12/14  9:00 AM  Result Value Ref Range   Glucose, GTT - 3 Hour 91 70 - 144 mg/dL    A:  HD#12  [redacted]w[redacted]d with twins with no measurable cervix and pessary in place..  P: 1) [place] by MFM showing no residual cervix and funneling of membranes into vagina, ceph/ceph. Pessary in place. Continue vaginal progesterone - Growth 9/1-A 543 g (56%), B 562 g (59%); will get growth this coming week- September 29 - will be 4 weeks from last. 2) S/p beta x 2 on 9/15-16 3) Continuous toco with FHT qshift 4) Advised  pt bed rest with BR priviledges 5) Will continue inpatient monitoring until at least 28 weeks per MFM recommendations 6) S/p NICU consult 7) patient failed 1 hr gtt, passed 3 hr, no gdm  Asymptomatic today, requires continued inpatient management at this time.     ROSS,KENDRA H.

## 2014-12-13 NOTE — Progress Notes (Signed)
HD#13 twins with no measurable cervix by [place].   Filed Vitals:   12/12/14 2305 12/13/14 0850 12/13/14 1618 12/13/14 2004  BP: 125/61 108/53 125/82   Pulse: 105 90 102   Temp: 98.1 F (36.7 C) 98.2 F (36.8 C) 98.6 F (37 C) 98.4 F (36.9 C)  TempSrc: Oral Oral Oral Oral  Resp: 18 18 20 18   Height:      Weight:      SpO2:         Abd: gravid, NT Ext: no CT FHT: present x 2 Toco: quiet  Lab Results  Component Value Date   WBC 18.0* 11/30/2014   HGB 10.0* 11/30/2014   HCT 30.0* 11/30/2014   MCV 90.9 11/30/2014   PLT 214 11/30/2014    A: HD#13 [redacted]w[redacted]d with twins with no measurable cervix and pessary in place..  P: 1) [place] by MFM showing no residual cervix and funneling of membranes into vagina, ceph/ceph. Pessary in place. Continue vaginal progesterone - Growth 9/1-A 543 g (56%), B 562 g (59%); will get growth this coming week- September 29 - will be 4 weeks from last. 2) S/p beta x 2 on 9/15-16 3) Continuous toco with FHT qshift 4) Advised pt bed rest with BR priviledges 5) Will continue inpatient monitoring until at least 28 weeks per MFM recommendations 6) S/p NICU consult 7) patient failed 1 hr gtt, passed 3 hr, no gdm  Asymptomatic today, requires continued inpatient management at this time.

## 2014-12-14 NOTE — Progress Notes (Signed)
HD#14 with di/mo twins with no measurable cervix by [place].  No complaints today.  Reports occ non-painful contractions.  No discharge, bleeding, LOF.  No pressure.    Filed Vitals:   12/13/14 2004 12/13/14 2005 12/13/14 2313 12/14/14 0806  BP:  117/90 116/56 112/64  Pulse:  104 85 88  Temp: 98.4 F (36.9 C)  98.2 F (36.8 C) 98.7 F (37.1 C)  TempSrc: Oral  Oral Oral  Resp: 18  16 18   Height:      Weight:      SpO2:         Abd: gravid, NT Ext: no CT FHT: present x 2 Toco: irritable   Lab Results  Component Value Date   WBC 18.0* 11/30/2014   HGB 10.0* 11/30/2014   HCT 30.0* 11/30/2014   MCV 90.9 11/30/2014   PLT 214 11/30/2014   Quick beside [place] today reveals twins remain vtx/vtx  A: HD#14 [redacted]w[redacted]d with twins with no measurable cervix and pessary in place..  P: 1) [place] by MFM showing no residual cervix and funneling of membranes into vagina, ceph/ceph. Pessary in place. Continue vaginal progesterone - Growth 9/1-A 543 g (56%), B 562 g (59%); f/u growth and eval for TTTS scheduled 9/30 at 915AM 2) S/p beta x 2 on 9/15-16 3) Continuous toco with FHT qshift 4) Advised pt bed rest with BR priviledges 5) Will continue inpatient monitoring until at least 28 weeks per MFM recommendations 6) S/p NICU consult 7) patient failed 1 hr gtt, passed 3 hr, no gdm  Asymptomatic today, requires continued inpatient management at this time given risk of preterm delivery.

## 2014-12-15 LAB — TYPE AND SCREEN
ABO/RH(D): O POS
ANTIBODY SCREEN: NEGATIVE

## 2014-12-15 NOTE — Progress Notes (Signed)
FHT dopplered via hand holding U/S transducer

## 2014-12-15 NOTE — Progress Notes (Signed)
Pt without complaints. No bleeding, cramping,change in vag discharge. Good FM x 2 VSSAF

## 2014-12-15 NOTE — Progress Notes (Signed)
At 1712 baby A represented by yellow on tracing, at 1722 baby A represented by blue on tracing and baby B represented by yellow on tracing

## 2014-12-16 ENCOUNTER — Ambulatory Visit (HOSPITAL_COMMUNITY)
Admission: RE | Admit: 2014-12-16 | Discharge: 2014-12-16 | Disposition: A | Discharge status: Home / Self Care | Payer: Medicaid Other | Source: Ambulatory Visit | Attending: Obstetrics & Gynecology | Admitting: Obstetrics & Gynecology

## 2014-12-16 NOTE — Progress Notes (Signed)
HD#15 with di/mo twins with no measurable cervix by [place].  Doing well without complaints    Filed Vitals:   12/15/14 1729 12/15/14 2009 12/15/14 2305 12/16/14 0909  BP: 121/70 119/73 122/73 119/71  Pulse: 94 102 100 101  Temp: 98.7 F (37.1 C) 98.6 F (37 C)  98.4 F (36.9 C)  TempSrc: Oral Oral  Oral  Resp: 18 16 18 18   Height:      Weight:      SpO2:         Abd: gravid, NT Ext: no CT FHT: present x 2 Toco: irritable   Lab Results  Component Value Date   WBC 18.0* 11/30/2014   HGB 10.0* 11/30/2014   HCT 30.0* 11/30/2014   MCV 90.9 11/30/2014   PLT 214 11/30/2014    A: HD#15 [redacted]w[redacted]d with twins with no measurable cervix and pessary in place..  P: 1) [place] by MFM showing no residual cervix and funneling of membranes into vagina, ceph/ceph. Pessary in place. Continue vaginal progesterone - Growth 9/1-A 543 g (56%), B 562 g (59%); f/u growth and eval for TTTS scheduled 9/30 at 915AM 2) S/p beta x 2 on 9/15-16 3) Continuous toco with FHT qshift 4) Advised pt bed rest with BR priviledges 5) Will continue inpatient monitoring until at least 28 weeks per MFM recommendations 6) S/p NICU consult 7) patient failed 1 hr gtt, passed 3 hr, no gdm  Asymptomatic today, requires continued inpatient management at this time given risk of preterm delivery.

## 2014-12-17 NOTE — Progress Notes (Signed)
HD#16 with di/mo twins with no measurable cervix by [place].  Doing well without complaints    Filed Vitals:   12/16/14 2139 12/16/14 2200 12/16/14 2300 12/16/14 2355  BP:    126/62  Pulse:    78  Temp:    98.2 F (36.8 C)  TempSrc:    Oral  Resp: 18 18 18 18   Height:      Weight:      SpO2:         Abd: gravid, NT Ext: no CT FHT: present x 2 Toco: irritable   Lab Results  Component Value Date   WBC 18.0* 11/30/2014   HGB 10.0* 11/30/2014   HCT 30.0* 11/30/2014   MCV 90.9 11/30/2014   PLT 214 11/30/2014    A: HD#16 [redacted]w[redacted]d with twins with no measurable cervix and pessary in place..  P: 1) [place] by MFM showing no residual cervix and funneling of membranes into vagina, ceph/ceph. Pessary in place. Continue vaginal progesterone - [place] for growth on 9/30 A: vtx 1036 gm , 2#5, 63%. B: vtx 959gm, 2#2, 51%. 7% discordance 2) S/p beta x 2 on 9/15-16 3) Continuous toco with FHT qshift 4) Advised pt bed rest with BR priviledges 5) Will continue inpatient monitoring until at least 28 weeks per MFM recommendations 6) S/p NICU consult 7) patient failed 1 hr gtt, passed 3 hr, no gdm  Asymptomatic today, requires continued inpatient management at this time given risk of preterm delivery.

## 2014-12-18 ENCOUNTER — Encounter (HOSPITAL_COMMUNITY): Payer: Self-pay | Admitting: Anesthesiology

## 2014-12-18 LAB — TYPE AND SCREEN
ABO/RH(D): O POS
Antibody Screen: NEGATIVE

## 2014-12-18 LAB — CBC WITH DIFFERENTIAL/PLATELET
Basophils Absolute: 0 10*3/uL (ref 0.0–0.1)
Basophils Relative: 0 %
EOS PCT: 1 %
Eosinophils Absolute: 0.1 10*3/uL (ref 0.0–0.7)
HEMATOCRIT: 29.9 % — AB (ref 36.0–46.0)
Hemoglobin: 10 g/dL — ABNORMAL LOW (ref 12.0–15.0)
LYMPHS ABS: 2.8 10*3/uL (ref 0.7–4.0)
LYMPHS PCT: 18 %
MCH: 30.4 pg (ref 26.0–34.0)
MCHC: 33.4 g/dL (ref 30.0–36.0)
MCV: 90.9 fL (ref 78.0–100.0)
MONO ABS: 1.3 10*3/uL — AB (ref 0.1–1.0)
Monocytes Relative: 9 %
NEUTROS ABS: 11.3 10*3/uL — AB (ref 1.7–7.7)
Neutrophils Relative %: 73 %
PLATELETS: 194 10*3/uL (ref 150–400)
RBC: 3.29 MIL/uL — ABNORMAL LOW (ref 3.87–5.11)
RDW: 14.3 % (ref 11.5–15.5)
WBC: 15.6 10*3/uL — ABNORMAL HIGH (ref 4.0–10.5)

## 2014-12-18 MED ORDER — LACTATED RINGERS IV SOLN
INTRAVENOUS | Status: DC
  Administered 2014-12-18 – 2014-12-20 (×4): via INTRAVENOUS

## 2014-12-18 MED ORDER — LACTATED RINGERS IV SOLN: 2.0000 g/h | INTRAVENOUS | Status: DC

## 2014-12-18 MED ORDER — DEXTROSE 5 % IV SOLN
500.0000 mg | INTRAVENOUS | Status: AC
  Administered 2014-12-18 – 2014-12-19 (×2): 500 mg via INTRAVENOUS
  Filled 2014-12-18 (×2): qty 500

## 2014-12-18 MED ORDER — MAGNESIUM SULFATE 50 % IJ SOLN
2.0000 g/h | INTRAVENOUS | Status: DC
  Administered 2014-12-18 – 2014-12-19 (×2): 2 g/h via INTRAVENOUS
  Filled 2014-12-18 (×2): qty 80

## 2014-12-18 MED ORDER — MAGNESIUM SULFATE BOLUS VIA INFUSION
4.0000 g | Freq: Once | INTRAVENOUS | Status: AC
  Administered 2014-12-18: 4 g via INTRAVENOUS
  Filled 2014-12-18: qty 500

## 2014-12-18 MED ORDER — AMPICILLIN SODIUM 2 G IJ SOLR
2.0000 g | Freq: Four times a day (QID) | INTRAMUSCULAR | Status: AC
  Administered 2014-12-18 – 2014-12-20 (×8): 2 g via INTRAVENOUS
  Filled 2014-12-18 (×8): qty 2000

## 2014-12-18 MED ORDER — AMOXICILLIN 500 MG PO CAPS
500.0000 mg | ORAL_CAPSULE | Freq: Three times a day (TID) | ORAL | Status: AC
  Administered 2014-12-20 – 2014-12-25 (×15): 500 mg via ORAL
  Filled 2014-12-18 (×15): qty 1

## 2014-12-18 MED ORDER — MAGNESIUM SULFATE 4 GM/100ML IV SOLN: 4.0000 g | Freq: Once | INTRAVENOUS | Status: DC

## 2014-12-18 MED ORDER — AZITHROMYCIN 250 MG PO TABS
500.0000 mg | ORAL_TABLET | Freq: Every day | ORAL | Status: AC
  Administered 2014-12-20 – 2014-12-24 (×5): 500 mg via ORAL
  Filled 2014-12-18 (×5): qty 2

## 2014-12-18 NOTE — Progress Notes (Signed)
Patient ID: Katherine Mendez, female   DOB: 25-Oct-1990, 24 y.o.   MRN: [id number]  I was called to see the patient by her nurse after the patient experienced a large gush of fluid and she was concerned that her water broke. Per the nurses inspection a large amount of clear fluid was noted to be draining vaginally consistent with premature preterm rupture of membranes.  Filed Vitals:   12/18/14 1007 12/18/14 1210 12/18/14 1605 12/18/14 1606  BP: 96/73 109/61 135/106 120/75  Pulse: 76 92 128 105  Temp: 97.9 F (36.6 C) 98.1 F (36.7 C)  98.4 F (36.9 C)  TempSrc: Oral Oral  Oral  Resp: 16 16    Height:      Weight:      SpO2:       FHR A 150, category 1 tracing FHR B 150 category 1 tracing Cervix exam deferred, pessary remains in situ Toco quiet  Baby A confirmed to be vertex Baby B was head on the maternal right, appears to be vertex  A/P: 24 year old G1 P0 at 27+0 weeks with mono/di twins, now PPROM 1) Will go ahead and start anti-biotics for latency with IV amoxicillin and azithromycin for 2 days followed by 5 days of oral and biotics 2) Will start magnesium sulfate for neuro protection with a 4 g load and 2 g per hour. We'll plan to continue this therapy for about 24 hours 3) the management of the pessary was discussed with Dr. Sharlet Salina of maternal-fetal medicine. Per his recommendations, we will leave the pessary in place. If there is evidence that the patient is actively laboring then we will remove the pessary 4) the NICU has been notified of the change in status of the patient

## 2014-12-18 NOTE — Progress Notes (Signed)
Patient states she had a clear large gush of fluid. Bed and pillows are saturated with fluid. Dr. Harrington Challenger notified. States she is starting a c/s and will be over afterwards to assess patient. Efm to be applied.

## 2014-12-18 NOTE — Progress Notes (Signed)
HD#17 with di/mo twins with no measurable cervix by [place].  Doing well without complaints    Filed Vitals:   12/16/14 2355 12/17/14 0958 12/17/14 1631 12/17/14 2015  BP: 126/62 100/61 132/68 114/50  Pulse: 78 114 95 88  Temp: 98.2 F (36.8 C) 98.3 F (36.8 C) 98.2 F (36.8 C) 98.3 F (36.8 C)  TempSrc: Oral Oral Oral Oral  Resp: 18 20 18 16   Height:      Weight:      SpO2:         Abd: gravid, NT Ext: no CT FHT: present x 2 Toco: irritable   Lab Results  Component Value Date   WBC 18.0* 11/30/2014   HGB 10.0* 11/30/2014   HCT 30.0* 11/30/2014   MCV 90.9 11/30/2014   PLT 214 11/30/2014    A: HD#17 27+0 d with twins with no measurable cervix and pessary in place..  P: 1) [place] by MFM showing no residual cervix and funneling of membranes into vagina, ceph/ceph. Pessary in place. Continue vaginal progesterone - [place] for growth on 9/30 A: vtx 1036 gm , 2#5, 63%. B: vtx 959gm, 2#2, 51%. 7% discordance 2) S/p beta x 2 on 9/15-16 3) Continuous toco with FHT qshift 4) Advised pt bed rest with BR priviledges 5) Will continue inpatient monitoring until at least 28 weeks per MFM recommendations 6) S/p NICU consult 7) patient failed 1 hr gtt, passed 3 hr, no gdm  Asymptomatic today, requires continued inpatient management at this time given risk of preterm delivery.

## 2014-12-18 NOTE — Progress Notes (Signed)
Dr foster here to discuss plan of care related to anesthesia with patient and patient's family

## 2014-12-18 NOTE — Anesthesia Preprocedure Evaluation (Deleted)
Anesthesia Evaluation  Patient identified by MRN, date of birth, ID band Patient awake    Reviewed: Allergy & Precautions, NPO status , Patient's Chart, lab work & pertinent test results  Airway Mallampati: II  TM Distance: >3 FB Neck ROM: Full    Dental no notable dental hx. (+) Teeth Intact   Pulmonary former smoker,    Pulmonary exam normal breath sounds clear to auscultation       Cardiovascular negative cardio ROS Normal cardiovascular exam Rhythm:Regular Rate:Normal     Neuro/Psych negative neurological ROS  negative psych ROS   GI/Hepatic negative GI ROS, Neg liver ROS,   Endo/Other  negative endocrine ROS  Renal/GU negative Renal ROS  negative genitourinary   Musculoskeletal negative musculoskeletal ROS (+)   Abdominal (+) - obese,   Peds  Hematology  (+) anemia ,   Anesthesia Other Findings   Reproductive/Obstetrics (+) Pregnancy Twin Gestation Vertex/Vertex 27 weeks 0/7 days SROM                             Anesthesia Physical Anesthesia Plan  ASA: II  Anesthesia Plan: Spinal   Post-op Pain Management:    Induction:   Airway Management Planned: Natural Airway  Additional Equipment:   Intra-op Plan:   Post-operative Plan:   Informed Consent: I have reviewed the patients History and Physical, chart, labs and discussed the procedure including the risks, benefits and alternatives for the proposed anesthesia with the patient or authorized representative who has indicated his/her understanding and acceptance.     Plan Discussed with: Anesthesiologist and Surgeon  Anesthesia Plan Comments: (Discussed with patient risks/benefits and alternatives of GA vs RA. Patient expresses understanding. Questions answered. Patient not contracting currently. Dr. Harrington Challenger will likely start MgSO4 for fetal neuroprotection. She has received 2 doses of steroids since admission.)         Anesthesia Quick Evaluation

## 2014-12-19 NOTE — Progress Notes (Signed)
Per MD, pt may have regular diet, will plan to d/c Mag at 24 hours and will re-address fetal monitoring changes  this afternoon.

## 2014-12-19 NOTE — Progress Notes (Signed)
Pt having no ctxs. She still has leakage of fluid. Good FM. VSSAF IMP/ Twin IUP at 27 weeks with PPROM. Plan/ Pessary removed., Discussed with MFM.

## 2014-12-20 LAB — GROUP B STREP BY PCR: Group B strep by PCR: NEGATIVE

## 2014-12-20 NOTE — Progress Notes (Signed)
23 y.o. G1P0000 [redacted]w[redacted]d HD#20 admitted for Twins with short cervix.  Pt currently stable despite PPROM.  Good FM.  Filed Vitals:   12/19/14 1724 12/19/14 1845 12/19/14 2027 12/20/14 0215  BP: 107/66  116/61 99/44  Pulse: 90  90 81  Temp: 98.2 F (36.8 C)  98.3 F (36.8 C)   TempSrc: Oral     Resp: 20 18 20 16   Height:      Weight:      SpO2:        Lungs CTA Cor RRR Abd  Soft, gravid, nontender Ex SCDs FHTs  130s/140s, good short term variability, NST R Toco  Now q 4-5  [place] on 9-30 VTX/VTX A1036 gm/ B 959 gm  A:  HD#20  [redacted]w[redacted]d with Twins/PPROM.  P: 1.  GBS has not been done- will do rapid GBS. 2.  VTX/VTX- if in labor, will deliver vaginally. 3.  No signs of infection. 4.  Pt had passed 3 hour gtt.  Erlin Gardella A

## 2014-12-21 LAB — TYPE AND SCREEN
ABO/RH(D): O POS
ANTIBODY SCREEN: NEGATIVE

## 2014-12-21 NOTE — Progress Notes (Signed)
After reviewing fetal monitoring with Dr. Rogue Bussing, she has requested that we begin fetal monitoring continuous until morning. New orders entered per telephone order.

## 2014-12-21 NOTE — Progress Notes (Signed)
HD#21 twins PPROM day #3.  Leaking, no bleeding, occ mild cramp unchanged from what she has been feeling for weeks.  Filed Vitals:   12/21/14 0038 12/21/14 0808 12/21/14 0927 12/21/14 1223  BP:  114/56  102/71  Pulse:  88  81  Temp:  98.8 F (37.1 C)  98 F (36.7 C)  TempSrc:  Oral  Oral  Resp: 18 18  18   Height:      Weight:   62.778 kg (138 lb 6.4 oz)   SpO2:        Fundus gravid, NT Ext: no CT FHT: NST 150 x2 mod var +10x10.  Occ variable decel approx 1 min with return to normal baseline TOCO: rare ctx SVE: deferred  Lab Results  Component Value Date   WBC 15.6* 12/18/2014   HGB 10.0* 12/18/2014   HCT 29.9* 12/18/2014   MCV 90.9 12/18/2014   PLT 194 12/18/2014    --/--/O POS (10/05 0812)/  A/P HD 21 twins PPROM, no residual cervix, Day 3 abx (1st day oral) A couple variable decels baby A, continue NST qshift, alerted RN to advise oncall doctor of any decels Continue current mgmt   Routine care.    Allyn Kenner

## 2014-12-22 NOTE — Progress Notes (Signed)
HD#22 twins PPROM day #4.  Leaking, no bleeding, occ mild cramp unchanged from what she has been feeling for weeks.  Filed Vitals:   12/21/14 2209 12/21/14 2304 12/22/14 1013 12/22/14 1245  BP:   111/65 105/57  Pulse:   78 86  Temp:   98.3 F (36.8 C) 98.4 F (36.9 C)  TempSrc:   Oral Oral  Resp: 18 18 18 18   Height:      Weight:      SpO2:        Fundus gravid, NT Ext: no CT FHT: NST 145 x2 mod var +10x10.  Baby A w occ variable decel yesterday evening, monitored over night and reassuring TOCO: rare ctx SVE: deferred  Lab Results  Component Value Date   WBC 15.6* 12/18/2014   HGB 10.0* 12/18/2014   HCT 29.9* 12/18/2014   MCV 90.9 12/18/2014   PLT 194 12/18/2014    --/--/O POS (10/05 0812)/  A/P HD 22 twins PPROM d#4, no residual cervix, Day 4 abx Cont latency antibiotics EFM reassuring overnight--ok to return to BID monitoring.  Discussed with patient indications for delivery in the setting of PPROm include s/sx abruption, intrauterine infection, preterm labor, NRFS    [place]

## 2014-12-23 MED ORDER — SODIUM CHLORIDE 0.9 % IJ SOLN
3.0000 mL | Freq: Two times a day (BID) | INTRAMUSCULAR | Status: DC
  Administered 2014-12-23: 3 mL via INTRAVENOUS

## 2014-12-23 NOTE — Progress Notes (Signed)
Spoke with Dr Ouida Sills about variables, he requested that I do a vaginal exam.

## 2014-12-23 NOTE — Progress Notes (Signed)
Patient continues to talk on her phone during her assessment.

## 2014-12-23 NOTE — Progress Notes (Signed)
Patient having her hair done, will monitor and get VS when completed.

## 2014-12-23 NOTE — Progress Notes (Signed)
Pt has had no ctxs. This am was doing well however 3-4 hours later there were decels with Twin A. Now resolved. Cx checked, no cord. Cx still 3cm , vertex Plan/ Will follow

## 2014-12-23 NOTE — Progress Notes (Signed)
Continuing to have hair done.

## 2014-12-24 LAB — TYPE AND SCREEN
ABO/RH(D): O POS
ANTIBODY SCREEN: NEGATIVE

## 2014-12-24 NOTE — Progress Notes (Signed)
Pt has no change in her status. No ctxs/bleeding. Good FM. No tachycardia or fever. On po abxs PLAN/ Cont with current mgmt

## 2014-12-25 NOTE — Progress Notes (Signed)
No changes. Still on po abxs.Marland Kitchen

## 2014-12-26 NOTE — Progress Notes (Signed)
Dr. Harrington Challenger on the unit. Reviewed tracing. Not concerned about uc's since pt is not feeling them.

## 2014-12-26 NOTE — Progress Notes (Signed)
HD#26 twins PPROM day #8.  Leaking, no bleeding, occ mild cramp unchanged from what she has been feeling for weeks.  Filed Vitals:   12/25/14 2151 12/25/14 2229 12/26/14 0000 12/26/14 0829  BP:    107/65  Pulse:    120  Temp:    98.1 F (36.7 C)  TempSrc:      Resp: 18 18 18 20   Height:      Weight:      SpO2:        Fundus gravid, NT Ext: no CT FHT: NST 145 x2 mod var, reassuring. occassional decelerations.  TOCO: more frequent contractions on monitor SVE: deferred  Lab Results  Component Value Date   WBC 15.6* 12/18/2014   HGB 10.0* 12/18/2014   HCT 29.9* 12/18/2014   MCV 90.9 12/18/2014   PLT 194 12/18/2014    --/--/O POS (10/08 0805)/  A/P HD 26 twins PPROM d#8, no residual cervix,  Abx for latency complete  Discussed with patient indications for delivery in the setting of PPROm include s/sx abruption, intrauterine infection, preterm labor, NRFS    Katherine Bryars H.

## 2014-12-27 LAB — TYPE AND SCREEN
ABO/RH(D): O POS
ANTIBODY SCREEN: NEGATIVE

## 2014-12-27 MED ORDER — INFLUENZA VAC SPLIT QUAD 0.5 ML IM SUSY
0.5000 mL | PREFILLED_SYRINGE | INTRAMUSCULAR | Status: AC
  Administered 2014-12-28: 0.5 mL via INTRAMUSCULAR

## 2014-12-27 NOTE — Progress Notes (Signed)
HD#27, PPROM #9.  Pt frustrated with hospital and constant interruptions.  Good FM, no bleeding.  No fever or tenderness.  Denies painful contractions, same crampiness as usual, unchanged. No other issues.  Filed Vitals:   12/26/14 1955 12/26/14 2112 12/27/14 1003 12/27/14 1207  BP: 119/62  105/57 116/64  Pulse: 101  97 100  Temp: 98.3 F (36.8 C)  98.6 F (37 C) 98.7 F (37.1 C)  TempSrc: Oral  Oral Oral  Resp: 20 18 16 16   Height:      Weight:      SpO2:       Abd: gravid, NT Ext: no CT FHT: 145/150x 2, mod variability +accels, occ varible TOCO: UI intermittently, occ ctx    Lab Results  Component Value Date   WBC 15.6* 12/18/2014   HGB 10.0* 12/18/2014   HCT 29.9* 12/18/2014   MCV 90.9 12/18/2014   PLT 194 12/18/2014    --/--/O POS (10/11 4599)  A/P HD#27, PPROM #9 di/di twins Will adjust lab draw to occur 10am daily so pt can sleep, asked nurse to place note on patient's door to limit interruptions.  Pt seems satisfied with the changes.  FMLA paperwork delivery, fee given to me in cash.  I brought to office and have receipt for pt. S/p latency abx S/p beta x 2 Upcoming MFM scan 10/14 Flu shot today  Routine care.    Allyn Kenner

## 2014-12-27 NOTE — Progress Notes (Signed)
Patient sleeping.  I will respect her sleep.

## 2014-12-27 NOTE — Progress Notes (Signed)
Patient stated "I'm eating" as I knocked and entered the room.  She does not want to be bothered at this time.

## 2014-12-29 ENCOUNTER — Inpatient Hospital Stay (HOSPITAL_COMMUNITY): Payer: Medicaid Other

## 2014-12-29 MED ORDER — BETAMETHASONE SOD PHOS & ACET 6 (3-3) MG/ML IJ SUSP
12.0000 mg | Freq: Once | INTRAMUSCULAR | Status: DC
  Filled 2014-12-29: qty 2

## 2014-12-29 NOTE — Progress Notes (Signed)
Twin A not capturing, maternal pulse tracing.

## 2014-12-29 NOTE — Progress Notes (Signed)
Patient refuses betamethasone.

## 2014-12-29 NOTE — Progress Notes (Signed)
24 y.o. G1P0000 [redacted]w[redacted]d HD#29 admitted for shortened cervix, now PPROM twins #10.  Pt currently stable with no c/o.  Good FM.  Filed Vitals:   12/28/14 1506 12/28/14 1804 12/28/14 1939 12/29/14 0023  BP:  118/82 119/71 123/58  Pulse: 106 133 108 94  Temp:  98 F (36.7 C) 98.8 F (37.1 C) 98.7 F (37.1 C)  TempSrc:  Oral Oral Oral  Resp:  18 16 16   Height:      Weight:      SpO2: 99%       Lungs CTA Cor RRR Abd  Soft, gravid, nontender Ex SCDs FHTs  Last night x2 130s, good short term variability, NST R; B had some variable decels. Toco  q 10-20  No results found for this or any previous visit (from the past 24 hour(s)).  A:  HD#29  [redacted]w[redacted]d with PPROM.  P: Still stable but strips with variables.  S/p BMZ, antibiotics and pessary removed.  Continue current observation.  NST this am.  Pulse has been increasing but not febrile- will check CBC tomorrow.    Varnika Butz A

## 2014-12-29 NOTE — Progress Notes (Signed)
Patient removed Fetal heart monitoring devices and refuses reapplication. Will contact MD.

## 2014-12-29 NOTE — Progress Notes (Signed)
FHTs still 140-150s for each with occ mild to moderate variables.  Toco less now with q 10-15  [place] A EFW 2#6 with sharp drop off in growth from 60 %ile to 27%ile.  Adequate fluid and VTX.  BPP 8/8.        B EFW 2#24, 57 %ile with 18% discordancy.  Adequate fluid and VTX.  BPP 8/8.    Despite drop in growth, ANT is still reassuring enough to continue current care.  Pulse has been increasing, ranging from from 100 to 129, but last was 97.  Afeb.    Will do a rescue dose of steroids tonight (first set given 9-15).  Will need to deliver if FHTs are cat 3 or if she develops clear signs of chorio.

## 2014-12-29 NOTE — Progress Notes (Signed)
At approx [place] called to ask that we put pt on the monitor. Called Philis Pique to review tracing. New orders given for moving MFM ultrasound from tomorrow to today and adding BPP and AFI. Called MFM and made aware.

## 2014-12-29 NOTE — Progress Notes (Signed)
Philis Pique called back and I faxed her the MFM [place] report.

## 2014-12-29 NOTE — Progress Notes (Signed)
Called and left message for Dr. Philis Pique to call back so that I might share with her the MFM [place] results.

## 2014-12-29 NOTE — Progress Notes (Signed)
Discussed patient needing to be scanned today with sonographers.  Patient should be able to be worked in around 2 or 3pm.  Financial controller, Agricultural consultant, of need for orders for [employer].  If patient needs to be seen sooner than 3, please call and we will try to work her in.

## 2014-12-30 ENCOUNTER — Encounter (HOSPITAL_COMMUNITY)
Admission: AD | Disposition: A | Discharge status: Home / Self Care | Payer: Self-pay | Source: Ambulatory Visit | Attending: Obstetrics and Gynecology

## 2014-12-30 ENCOUNTER — Ambulatory Visit (HOSPITAL_COMMUNITY)

## 2014-12-30 ENCOUNTER — Inpatient Hospital Stay (HOSPITAL_COMMUNITY): Payer: Medicaid Other | Admitting: Certified Registered Nurse Anesthetist

## 2014-12-30 ENCOUNTER — Encounter (HOSPITAL_COMMUNITY): Payer: Self-pay

## 2014-12-30 ENCOUNTER — Inpatient Hospital Stay (HOSPITAL_COMMUNITY): Payer: Medicaid Other

## 2014-12-30 DIAGNOSIS — IMO0001 Reserved for inherently not codable concepts without codable children: Secondary | ICD-10-CM

## 2014-12-30 DIAGNOSIS — Z3490 Encounter for supervision of normal pregnancy, unspecified, unspecified trimester: Secondary | ICD-10-CM

## 2014-12-30 LAB — CBC WITH DIFFERENTIAL/PLATELET
BASOS PCT: 0 %
Basophils Absolute: 0 10*3/uL (ref 0.0–0.1)
EOS PCT: 1 %
Eosinophils Absolute: 0.2 10*3/uL (ref 0.0–0.7)
HEMATOCRIT: 29.2 % — AB (ref 36.0–46.0)
Hemoglobin: 9.9 g/dL — ABNORMAL LOW (ref 12.0–15.0)
LYMPHS ABS: 2.8 10*3/uL (ref 0.7–4.0)
Lymphocytes Relative: 17 %
MCH: 30.7 pg (ref 26.0–34.0)
MCHC: 33.9 g/dL (ref 30.0–36.0)
MCV: 90.7 fL (ref 78.0–100.0)
Monocytes Absolute: 1.6 10*3/uL — ABNORMAL HIGH (ref 0.1–1.0)
Monocytes Relative: 10 %
NEUTROS ABS: 11.7 10*3/uL — AB (ref 1.7–7.7)
Neutrophils Relative %: 72 %
OTHER: 0 %
Platelets: 184 10*3/uL (ref 150–400)
RBC: 3.22 MIL/uL — ABNORMAL LOW (ref 3.87–5.11)
RDW: 14 % (ref 11.5–15.5)
WBC: 16.3 10*3/uL — AB (ref 4.0–10.5)

## 2014-12-30 LAB — TYPE AND SCREEN
ABO/RH(D): O POS
Antibody Screen: NEGATIVE

## 2014-12-30 SURGERY — Surgical Case
Anesthesia: General | Site: Vagina

## 2014-12-30 MED ORDER — OXYTOCIN 10 UNIT/ML IJ SOLN
INTRAMUSCULAR | Status: AC
  Filled 2014-12-30: qty 1

## 2014-12-30 MED ORDER — PHENYLEPHRINE 40 MCG/ML (10ML) SYRINGE FOR IV PUSH (FOR BLOOD PRESSURE SUPPORT): 80.0000 ug | PREFILLED_SYRINGE | INTRAVENOUS | Status: DC | PRN

## 2014-12-30 MED ORDER — OXYTOCIN 40 UNITS IN LACTATED RINGERS INFUSION - SIMPLE MED
62.5000 mL/h | INTRAVENOUS | Status: DC
  Filled 2014-12-30: qty 1000

## 2014-12-30 MED ORDER — DIPHENHYDRAMINE HCL 12.5 MG/5ML PO ELIX: 12.5000 mg | ORAL_SOLUTION | Freq: Four times a day (QID) | ORAL | Status: DC | PRN

## 2014-12-30 MED ORDER — DIPHENHYDRAMINE HCL 50 MG/ML IJ SOLN: 12.5000 mg | INTRAMUSCULAR | Status: DC | PRN

## 2014-12-30 MED ORDER — LANOLIN HYDROUS EX OINT
1.0000 | TOPICAL_OINTMENT | CUTANEOUS | Status: DC | PRN
Start: 2014-12-30 — End: 2015-01-03
  Administered 2014-12-31: 1 via TOPICAL

## 2014-12-30 MED ORDER — FENTANYL CITRATE (PF) 250 MCG/5ML IJ SOLN
INTRAMUSCULAR | Status: AC
  Filled 2014-12-30: qty 25

## 2014-12-30 MED ORDER — DIBUCAINE 1 % RE OINT: 1.0000 "application " | TOPICAL_OINTMENT | RECTAL | Status: DC | PRN

## 2014-12-30 MED ORDER — LACTATED RINGERS IV SOLN
INTRAVENOUS | Status: DC | PRN
  Administered 2014-12-30: 10:00:00 via INTRAVENOUS

## 2014-12-30 MED ORDER — EPHEDRINE 5 MG/ML INJ: 10.0000 mg | INTRAVENOUS | Status: DC | PRN

## 2014-12-30 MED ORDER — KETOROLAC TROMETHAMINE 30 MG/ML IJ SOLN
INTRAMUSCULAR | Status: AC
  Filled 2014-12-30: qty 1

## 2014-12-30 MED ORDER — FENTANYL CITRATE (PF) 250 MCG/5ML IJ SOLN
INTRAMUSCULAR | Status: DC | PRN
  Administered 2014-12-30: 250 ug via INTRAVENOUS

## 2014-12-30 MED ORDER — NALOXONE HCL 0.4 MG/ML IJ SOLN: 0.4000 mg | INTRAMUSCULAR | Status: DC | PRN

## 2014-12-30 MED ORDER — CEFAZOLIN SODIUM-DEXTROSE 2-3 GM-% IV SOLR
INTRAVENOUS | Status: DC | PRN
  Administered 2014-12-30: 2 g via INTRAVENOUS

## 2014-12-30 MED ORDER — OXYTOCIN 40 UNITS IN LACTATED RINGERS INFUSION - SIMPLE MED: 62.5000 mL/h | INTRAVENOUS | Status: AC

## 2014-12-30 MED ORDER — SIMETHICONE 80 MG PO CHEW: 80.0000 mg | CHEWABLE_TABLET | ORAL | Status: DC | PRN

## 2014-12-30 MED ORDER — DIPHENHYDRAMINE HCL 50 MG/ML IJ SOLN: 12.5000 mg | Freq: Four times a day (QID) | INTRAMUSCULAR | Status: DC | PRN

## 2014-12-30 MED ORDER — CITRIC ACID-SODIUM CITRATE 334-500 MG/5ML PO SOLN: 30.0000 mL | ORAL | Status: DC | PRN

## 2014-12-30 MED ORDER — SCOPOLAMINE 1 MG/3DAYS TD PT72
MEDICATED_PATCH | TRANSDERMAL | Status: AC
  Filled 2014-12-30: qty 1

## 2014-12-30 MED ORDER — HYDROMORPHONE HCL 1 MG/ML IJ SOLN
0.2500 mg | INTRAMUSCULAR | Status: DC | PRN
  Administered 2014-12-30: 0.5 mg via INTRAVENOUS

## 2014-12-30 MED ORDER — TETANUS-DIPHTH-ACELL PERTUSSIS 5-2.5-18.5 LF-MCG/0.5 IM SUSP
0.5000 mL | Freq: Once | INTRAMUSCULAR | Status: AC
  Administered 2014-12-31: 0.5 mL via INTRAMUSCULAR

## 2014-12-30 MED ORDER — LACTATED RINGERS IV SOLN: INTRAVENOUS | Status: DC

## 2014-12-30 MED ORDER — IBUPROFEN 600 MG PO TABS
600.0000 mg | ORAL_TABLET | Freq: Four times a day (QID) | ORAL | Status: DC
  Administered 2014-12-30 – 2015-01-03 (×15): 600 mg via ORAL
  Filled 2014-12-30 (×15): qty 1

## 2014-12-30 MED ORDER — ONDANSETRON HCL 4 MG/2ML IJ SOLN
INTRAMUSCULAR | Status: DC | PRN
  Administered 2014-12-30: 4 mg via INTRAVENOUS

## 2014-12-30 MED ORDER — PRENATAL MULTIVITAMIN CH
1.0000 | ORAL_TABLET | Freq: Every day | ORAL | Status: DC
  Administered 2014-12-31 – 2015-01-03 (×4): 1 via ORAL
  Filled 2014-12-30 (×4): qty 1

## 2014-12-30 MED ORDER — OXYTOCIN 40 UNITS IN LACTATED RINGERS INFUSION - SIMPLE MED: 62.5000 mL/h | INTRAVENOUS | Status: DC

## 2014-12-30 MED ORDER — ONDANSETRON HCL 4 MG/2ML IJ SOLN: 4.0000 mg | Freq: Four times a day (QID) | INTRAMUSCULAR | Status: DC | PRN

## 2014-12-30 MED ORDER — WITCH HAZEL-GLYCERIN EX PADS: 1.0000 "application " | MEDICATED_PAD | CUTANEOUS | Status: DC | PRN

## 2014-12-30 MED ORDER — HYDROMORPHONE HCL 1 MG/ML IJ SOLN
INTRAMUSCULAR | Status: AC
  Filled 2014-12-30: qty 1

## 2014-12-30 MED ORDER — DEXAMETHASONE SODIUM PHOSPHATE 4 MG/ML IJ SOLN
INTRAMUSCULAR | Status: AC
  Filled 2014-12-30: qty 1

## 2014-12-30 MED ORDER — OXYCODONE-ACETAMINOPHEN 5-325 MG PO TABS: 1.0000 | ORAL_TABLET | ORAL | Status: DC | PRN

## 2014-12-30 MED ORDER — MENTHOL 3 MG MT LOZG: 1.0000 | LOZENGE | OROMUCOSAL | Status: DC | PRN

## 2014-12-30 MED ORDER — LACTATED RINGERS IV SOLN: 500.0000 mL | INTRAVENOUS | Status: DC | PRN

## 2014-12-30 MED ORDER — LACTATED RINGERS IV SOLN
INTRAVENOUS | Status: DC
  Administered 2014-12-30: 20:00:00 via INTRAVENOUS

## 2014-12-30 MED ORDER — OXYCODONE-ACETAMINOPHEN 5-325 MG PO TABS: 2.0000 | ORAL_TABLET | ORAL | Status: DC | PRN

## 2014-12-30 MED ORDER — SENNOSIDES-DOCUSATE SODIUM 8.6-50 MG PO TABS
2.0000 | ORAL_TABLET | ORAL | Status: DC
  Administered 2014-12-30 – 2015-01-03 (×4): 2 via ORAL
  Filled 2014-12-30 (×4): qty 2

## 2014-12-30 MED ORDER — OXYTOCIN BOLUS FROM INFUSION: 500.0000 mL | INTRAVENOUS | Status: DC

## 2014-12-30 MED ORDER — MIDAZOLAM HCL 2 MG/2ML IJ SOLN
INTRAMUSCULAR | Status: DC | PRN
  Administered 2014-12-30: 2 mg via INTRAVENOUS

## 2014-12-30 MED ORDER — SIMETHICONE 80 MG PO CHEW
80.0000 mg | CHEWABLE_TABLET | ORAL | Status: DC
  Administered 2015-01-01 – 2015-01-03 (×3): 80 mg via ORAL
  Filled 2014-12-30 (×4): qty 1

## 2014-12-30 MED ORDER — SUCCINYLCHOLINE CHLORIDE 20 MG/ML IJ SOLN
INTRAMUSCULAR | Status: AC
  Filled 2014-12-30: qty 1

## 2014-12-30 MED ORDER — FENTANYL 2.5 MCG/ML BUPIVACAINE 1/10 % EPIDURAL INFUSION (WH - ANES): 14.0000 mL/h | INTRAMUSCULAR | Status: DC | PRN

## 2014-12-30 MED ORDER — PHENYLEPHRINE 40 MCG/ML (10ML) SYRINGE FOR IV PUSH (FOR BLOOD PRESSURE SUPPORT)
PREFILLED_SYRINGE | INTRAVENOUS | Status: AC
  Filled 2014-12-30: qty 10

## 2014-12-30 MED ORDER — DEXAMETHASONE SODIUM PHOSPHATE 4 MG/ML IJ SOLN
INTRAMUSCULAR | Status: DC | PRN
  Administered 2014-12-30: 4 mg via INTRAVENOUS

## 2014-12-30 MED ORDER — ONDANSETRON HCL 4 MG/2ML IJ SOLN
INTRAMUSCULAR | Status: AC
  Filled 2014-12-30: qty 2

## 2014-12-30 MED ORDER — LIDOCAINE HCL (PF) 1 % IJ SOLN
30.0000 mL | INTRAMUSCULAR | Status: DC | PRN
  Filled 2014-12-30: qty 30

## 2014-12-30 MED ORDER — HYDROMORPHONE HCL 1 MG/ML IJ SOLN
INTRAMUSCULAR | Status: DC | PRN
  Administered 2014-12-30 (×2): 0.5 mg via INTRAVENOUS

## 2014-12-30 MED ORDER — HYDROMORPHONE 0.3 MG/ML IV SOLN
INTRAVENOUS | Status: DC
  Administered 2014-12-30: 3 mg via INTRAVENOUS
  Administered 2014-12-30: 0.3 mg via INTRAVENOUS
  Administered 2014-12-30: 0.9 mg via INTRAVENOUS
  Administered 2014-12-30: 13:00:00 via INTRAVENOUS
  Filled 2014-12-30: qty 25

## 2014-12-30 MED ORDER — PHENYLEPHRINE HCL 10 MG/ML IJ SOLN
INTRAMUSCULAR | Status: DC | PRN
  Administered 2014-12-30: 40 ug via INTRAVENOUS

## 2014-12-30 MED ORDER — OXYCODONE-ACETAMINOPHEN 5-325 MG PO TABS
2.0000 | ORAL_TABLET | ORAL | Status: DC | PRN
Start: 2014-12-30 — End: 2015-01-03

## 2014-12-30 MED ORDER — PROMETHAZINE HCL 25 MG/ML IJ SOLN: 6.2500 mg | INTRAMUSCULAR | Status: DC | PRN

## 2014-12-30 MED ORDER — PHENYLEPHRINE 40 MCG/ML (10ML) SYRINGE FOR IV PUSH (FOR BLOOD PRESSURE SUPPORT)
PREFILLED_SYRINGE | INTRAVENOUS | Status: AC
  Filled 2014-12-30: qty 20

## 2014-12-30 MED ORDER — DIPHENHYDRAMINE HCL 25 MG PO CAPS: 25.0000 mg | ORAL_CAPSULE | Freq: Four times a day (QID) | ORAL | Status: DC | PRN

## 2014-12-30 MED ORDER — KETOROLAC TROMETHAMINE 30 MG/ML IJ SOLN
30.0000 mg | Freq: Once | INTRAMUSCULAR | Status: AC | PRN
  Administered 2014-12-30: 30 mg via INTRAVENOUS

## 2014-12-30 MED ORDER — MEPERIDINE HCL 25 MG/ML IJ SOLN: 6.2500 mg | INTRAMUSCULAR | Status: DC | PRN

## 2014-12-30 MED ORDER — SUCCINYLCHOLINE CHLORIDE 20 MG/ML IJ SOLN
INTRAMUSCULAR | Status: DC | PRN
  Administered 2014-12-30: 140 mg via INTRAVENOUS

## 2014-12-30 MED ORDER — MIDAZOLAM HCL 2 MG/2ML IJ SOLN
INTRAMUSCULAR | Status: AC
  Filled 2014-12-30: qty 4

## 2014-12-30 MED ORDER — FENTANYL 2.5 MCG/ML BUPIVACAINE 1/10 % EPIDURAL INFUSION (WH - ANES)
INTRAMUSCULAR | Status: AC
  Filled 2014-12-30: qty 125

## 2014-12-30 MED ORDER — PROPOFOL 10 MG/ML IV BOLUS
INTRAVENOUS | Status: DC | PRN
  Administered 2014-12-30: 160 mg via INTRAVENOUS
  Administered 2014-12-30: 40 mg via INTRAVENOUS
  Administered 2014-12-30: 50 mg via INTRAVENOUS

## 2014-12-30 MED ORDER — SCOPOLAMINE 1 MG/3DAYS TD PT72
MEDICATED_PATCH | TRANSDERMAL | Status: DC | PRN
  Administered 2014-12-30: 1 via TRANSDERMAL

## 2014-12-30 MED ORDER — LACTATED RINGERS IV SOLN
INTRAVENOUS | Status: DC
  Administered 2014-12-30: 08:00:00 via INTRAVENOUS

## 2014-12-30 MED ORDER — OXYCODONE-ACETAMINOPHEN 5-325 MG PO TABS
1.0000 | ORAL_TABLET | ORAL | Status: DC | PRN
  Administered 2014-12-31 – 2015-01-01 (×5): 1 via ORAL
  Filled 2014-12-30 (×5): qty 1

## 2014-12-30 MED ORDER — LIDOCAINE HCL (PF) 1 % IJ SOLN: 30.0000 mL | INTRAMUSCULAR | Status: DC | PRN

## 2014-12-30 MED ORDER — SIMETHICONE 80 MG PO CHEW
80.0000 mg | CHEWABLE_TABLET | Freq: Three times a day (TID) | ORAL | Status: DC
  Administered 2014-12-30 – 2015-01-03 (×11): 80 mg via ORAL
  Filled 2014-12-30 (×10): qty 1

## 2014-12-30 MED ORDER — SODIUM CHLORIDE 0.9 % IJ SOLN: 9.0000 mL | INTRAMUSCULAR | Status: DC | PRN

## 2014-12-30 MED ORDER — ACETAMINOPHEN 325 MG PO TABS: 650.0000 mg | ORAL_TABLET | ORAL | Status: DC | PRN

## 2014-12-30 MED ORDER — FLEET ENEMA 7-19 GM/118ML RE ENEM: 1.0000 | ENEMA | RECTAL | Status: DC | PRN

## 2014-12-30 MED ORDER — LACTATED RINGERS IV SOLN
40.0000 [IU] | INTRAVENOUS | Status: DC | PRN
  Administered 2014-12-30: 40 [IU] via INTRAVENOUS

## 2014-12-30 SURGICAL SUPPLY — 33 items
APL SKNCLS STERI-STRIP NONHPOA (GAUZE/BANDAGES/DRESSINGS) ×2
BENZOIN TINCTURE PRP APPL 2/3 (GAUZE/BANDAGES/DRESSINGS) ×3 IMPLANT
CLAMP CORD UMBIL (MISCELLANEOUS) IMPLANT
CLOTH BEACON ORANGE TIMEOUT ST (SAFETY) ×3 IMPLANT
DRAPE SHEET LG 3/4 BI-LAMINATE (DRAPES) IMPLANT
DRSG OPSITE POSTOP 4X10 (GAUZE/BANDAGES/DRESSINGS) ×3 IMPLANT
DURAPREP 26ML APPLICATOR (WOUND CARE) ×3 IMPLANT
ELECT REM PT RETURN 9FT ADLT (ELECTROSURGICAL) ×3
ELECTRODE REM PT RTRN 9FT ADLT (ELECTROSURGICAL) ×2 IMPLANT
EXTRACTOR VACUUM KIWI (MISCELLANEOUS) IMPLANT
GLOVE BIO SURGEON STRL SZ 6 (GLOVE) ×3 IMPLANT
GLOVE INDICATOR 6.5 STRL GRN (GLOVE) ×3 IMPLANT
GOWN STRL REUS W/TWL LRG LVL3 (GOWN DISPOSABLE) ×6 IMPLANT
KIT ABG SYR 3ML LUER SLIP (SYRINGE) IMPLANT
NEEDLE HYPO 25X5/8 SAFETYGLIDE (NEEDLE) IMPLANT
NS IRRIG 1000ML POUR BTL (IV SOLUTION) ×3 IMPLANT
PACK C SECTION WH (CUSTOM PROCEDURE TRAY) ×3 IMPLANT
PAD OB MATERNITY 4.3X12.25 (PERSONAL CARE ITEMS) ×3 IMPLANT
PENCIL SMOKE EVAC W/HOLSTER (ELECTROSURGICAL) ×3 IMPLANT
RTRCTR C-SECT PINK 25CM LRG (MISCELLANEOUS) ×3 IMPLANT
STRIP CLOSURE SKIN 1/2X4 (GAUZE/BANDAGES/DRESSINGS) ×3 IMPLANT
SUT MNCRL 0 VIOLET CTX 36 (SUTURE) ×4 IMPLANT
SUT MNCRL AB 3-0 PS2 27 (SUTURE) ×3 IMPLANT
SUT MONOCRYL 0 CTX 36 (SUTURE) ×2
SUT PLAIN 0 NONE (SUTURE) IMPLANT
SUT PLAIN 2 0 (SUTURE) ×3
SUT PLAIN ABS 2-0 CT1 27XMFL (SUTURE) ×2 IMPLANT
SUT VIC AB 0 CTX 36 (SUTURE) ×9
SUT VIC AB 0 CTX36XBRD ANBCTRL (SUTURE) ×4 IMPLANT
SUT VIC AB 2-0 CT1 27 (SUTURE) ×6
SUT VIC AB 2-0 CT1 TAPERPNT 27 (SUTURE) ×2 IMPLANT
TOWEL OR 17X24 6PK STRL BLUE (TOWEL DISPOSABLE) ×3 IMPLANT
TRAY FOLEY CATH SILVER 14FR (SET/KITS/TRAYS/PACK) ×3 IMPLANT

## 2014-12-30 NOTE — Progress Notes (Signed)
Orders received to start IV & initiate IVF's

## 2014-12-30 NOTE — Anesthesia Preprocedure Evaluation (Signed)
Anesthesia Evaluation  Patient identified by MRN, date of birth, ID band Patient awake    Reviewed: Allergy & Precautions, NPO status , Patient's Chart, lab work & pertinent test results  Airway Mallampati: II  TM Distance: >3 FB Neck ROM: Full    Dental no notable dental hx. (+) Teeth Intact   Pulmonary former smoker,    Pulmonary exam normal breath sounds clear to auscultation       Cardiovascular negative cardio ROS Normal cardiovascular exam Rhythm:Regular Rate:Normal     Neuro/Psych negative neurological ROS  negative psych ROS   GI/Hepatic negative GI ROS, Neg liver ROS,   Endo/Other  negative endocrine ROS  Renal/GU negative Renal ROS  negative genitourinary   Musculoskeletal negative musculoskeletal ROS (+)   Abdominal (+) - obese,   Peds  Hematology  (+) anemia ,   Anesthesia Other Findings   Reproductive/Obstetrics (+) Pregnancy Twin Gestation Vertex/Vertex 27 weeks 0/7 days SROM                             Anesthesia Physical  Anesthesia Plan  ASA: II  Anesthesia Plan: General   Post-op Pain Management:    Induction: Intravenous, Rapid sequence and Cricoid pressure planned  Airway Management Planned: Oral ETT  Additional Equipment:   Intra-op Plan:   Post-operative Plan: Extubation in OR  Informed Consent: I have reviewed the patients History and Physical, chart, labs and discussed the procedure including the risks, benefits and alternatives for the proposed anesthesia with the patient or authorized representative who has indicated his/her understanding and acceptance.   Dental advisory given  Plan Discussed with: Anesthesiologist, Surgeon and CRNA  Anesthesia Plan Comments: (Discussed with patient risks/benefits and alternatives of GA vs RA. Patient expresses understanding. Questions answered. Patient not contracting currently. Dr. Harrington Challenger will likely start  MgSO4 for fetal neuroprotection. She has received 2 doses of steroids since admission.)        Anesthesia Quick Evaluation

## 2014-12-30 NOTE — Progress Notes (Signed)
Pt to L&D via bed secondary active labor.

## 2014-12-30 NOTE — Progress Notes (Signed)
Patient complaining of abdominal pain. Put on the monitor and will assess contractions.

## 2014-12-30 NOTE — Progress Notes (Signed)
Called to evaluate patient.  She reports contractions that started at 0500 and became increasingly painful at 0700.  Per RN, SVE changed to 4/80/0. She was noted to have irregular ctx yesterday that resolved  On my exam, patient is uncomfortable w contractions.    BP 109/57 mmHg  Pulse 98  Temp(Src) 98.4 F (36.9 C) (Oral)  Resp 18  Ht 5\' 1"  (1.549 m)  Wt 63.095 kg (139 lb 1.6 oz)  BMI 26.30 kg/m2  SpO2 100%  LMP 06/12/2014  Toco: q2 min EFM: A--140s, mod var, occ variable decel, B: mod var, occ var decel SVE: 6/100/0, vtx  BSUS confirms baby A vtx, mat right, baby B vtx maternal left  24 yo G1 @ [redacted]w[redacted]d w di-mo twins, labor Afebrile, no s/sx chorioamnionitis at this time Transfer to L&D Epidural upon maternal request Plan delivery in OR with double set up.  Pt desires vaginal delivery.  She is aware that there is a ~10% chance that baby B could vert to a breech position.  She is aware that breech extraction of baby B will no be performed due to EFW.   She is aware that NRFS of baby B after delivery of baby A could result in urgent cesarean section for baby B.   GBS negative BMZ 9/15 and 9/16.  Pt declined rescue dose of bmz last night NICU to be alerted T&S and cbc w diff now A 1066 g / B 1302g

## 2014-12-30 NOTE — Anesthesia Procedure Notes (Signed)
Procedure Name: Intubation Date/Time: 12/30/2014 9:44 AM Performed by: Raenette Rover Pre-anesthesia Checklist: Patient identified, Emergency Drugs available, Suction available and Patient being monitored Patient Re-evaluated:Patient Re-evaluated prior to inductionOxygen Delivery Method: Circle system utilized Preoxygenation: Pre-oxygenation with 100% oxygen Intubation Type: IV induction, Rapid sequence and Cricoid Pressure applied Laryngoscope Size: Glidescope (LoPro3) Grade View: Grade I Tube type: Oral Tube size: 7.0 mm Number of attempts: 1 Airway Equipment and Method: Stylet Placement Confirmation: ETT inserted through vocal cords under direct vision,  positive ETCO2,  CO2 detector and breath sounds checked- equal and bilateral Secured at: 21 cm Tube secured with: Tape Dental Injury: Teeth and Oropharynx as per pre-operative assessment

## 2014-12-30 NOTE — Transfer of Care (Signed)
Immediate Anesthesia Transfer of Care Note  Patient: Katherine Mendez  Procedure(s) Performed: Procedure(s): VAGINAL DELIVERY  Patient Location: PACU  Anesthesia Type:General  Level of Consciousness: awake, alert , oriented and patient cooperative  Airway & Oxygen Therapy: Patient Spontanous Breathing and Patient connected to nasal cannula oxygen  Post-op Assessment: Report given to RN and Post -op Vital signs reviewed and stable  Post vital signs: Reviewed and stable  Last Vitals:  Filed Vitals:   12/30/14 0831  BP: 116/70  Pulse: 95  Temp:   Resp: 20    Complications: No apparent anesthesia complications

## 2014-12-30 NOTE — Anesthesia Postprocedure Evaluation (Signed)
  Anesthesia Post-op Note  Patient: Katherine Mendez  Procedure(s) Performed: Procedure(s): VAGINAL DELIVERY  Patient Location: PACU  Anesthesia Type:General  Level of Consciousness: awake, alert  and oriented  Airway and Oxygen Therapy: Patient Spontanous Breathing and Patient connected to nasal cannula oxygen  Post-op Pain: mild  Post-op Assessment: Post-op Vital signs reviewed, Patient's Cardiovascular Status Stable, Respiratory Function Stable, Patent Airway and No signs of Nausea or vomiting              Post-op Vital Signs: Reviewed and stable  Last Vitals:  Filed Vitals:   12/30/14 1100  BP: 115/63  Pulse:   Temp: 36.6 C  Resp: 17    Complications: No apparent anesthesia complications

## 2014-12-30 NOTE — Progress Notes (Signed)
Dr. Carlis Abbott @ bedside performing bedside ultrasound to verify fetal presentations.  Both fetuses vertex.

## 2014-12-30 NOTE — Op Note (Signed)
Cesarean Section Procedure Note  Pre-operative Diagnosis: 1. Dichorionic-monoamniotic twin pregnancy at [redacted]w[redacted]d  2. Preterm premature rupture of membranes  3. Non-reassuring fetal status of baby B  Post-operative Diagnosis: same as above  Surgeon: Jerelyn Charles, MD  Procedure: Primary low transverse cesarean section   Anesthesia: General anesthesia  Estimated Blood Loss: 600 mL         Drains: Foley catheter         Specimens: placenta to pathology         Implants: none         Complications:  None; patient tolerated the procedure well.         Disposition: PACU - hemodynamically stable.  Findings:  Normal uterus, tubes and ovaries bilaterally.  Viable female infant, 1280g (2lb 13oz) Apgars 5, 7.    Procedure Details   After general anesthesia was found to adequate , the patient was placed in the dorsal supine position with a leftward tilt, draped and prepped in the usual sterile manner. A Pfannenstiel incision was made and carried down through the subcutaneous tissue to the fascia. The fascia was incised in the midline and the fascial incision was extended  Laterally with blunt dissection.  The rectus was then dissected off with blunt dissection both superiorly and inferiorly, and the peritoneum entered bluntly and stretch.  A bladder blade was placed.  The bladder was below the lower uterine segment.  The scalpel was then used to make a low transverse incision on the uterus which was extended in the cephalad-caudad direction with blunt dissection. The fluid was clear. The fetal vertex was identified, elevated out of the pelvis and brought to the hysterotomy. The head was delivered easily followed by the shoulders and body.  The placenta delivered immediately following delivery of the fetus.   The cord was clamped and cut and the infant was passed to the waiting neonatologist.Placenta was sent to pathology.  Cord gas unable to be obtained.    The hysterotomy was repaired with #0  Monocryl in running locked fashion.  A second imbricating layer was placed.  The serosal edges of the incision were oozy, and bovie cautery was used to achieve hemostasis.  The hysterotomy was reexamined and excellent hemostasis was noted.  The bladder blade was removed from the abdomen. The peritoneum was examined and all vessels noted to be hemostatic. The abdominal cavity was cleared of all clot and debris.  The peritoneum was closed with 2-0 vicryl in a running fashion, taking care to avoid the many large peritoneal vessels. The fascia and rectus muscles were inspected and were hemostatic. The fascia was closed with 0 Vicryl in a running fashion. The subcuticular layer was irrigated and all bleeders cauterized.  The subcutaneous layer was re approximated with interrupted 3-0 plain gut.  The skin was closed with 3-0 monocryl in a subcuticular fashion. The incision was dressed with benzoine, steri strips and pressure dressing. All sponge lap and needle counts were correct x3. A full instrument count was not completed prior to delivery (retractors were not counted), therefore and abdominal xray was obtained.  Radiology confirmed that there were no instruments in the abdomen prior to extubation.  Patient tolerated the procedure well and recovered in stable condition following the procedure.

## 2014-12-30 NOTE — Progress Notes (Signed)
Called NICU to see about seeing babies.  NICU stated that this was not a good time to visit.  Her babies are fine but they were busy with another baby.  Patient verbalized an understanding

## 2014-12-30 NOTE — Lactation Note (Signed)
This note was copied from the chart of [place]. Lactation Consultation Note  Patient Name: Katherine Mendez NOIBB'C Date: 12/30/2014 Reason for consult: Initial assessment;NICU baby;Infant < 6lbs;Multiple gestation    Of this mom of NICU twins, now 84 hours old, and 28 5/[redacted] weeks gestation. Mom wants to provide EBM for her babies, so I started her pumping with DEP, in premie setting. Mom was decreased to 21 flanges, with a good fit. Mom cautioned to increase to 24 as needed. Mom was able to express 2-3 ml's of colostrum, which MGM was bring to the baby. Mom has not seen the babies yet, and is on a PCA pump, very sleepy. Mom is active with WIC, so fax sent for DEP. Mom very educated about breastfeeding, and was concerned that she has not taken her BF class yet. I told her if she was up to it at the time, she could still go to the class. Mom knows to call for questions/concerns.    Maternal Data Formula Feeding for Exclusion: No (babies in the NICU) Has patient been taught Hand Expression?: Yes Does the patient have breastfeeding experience prior to this delivery?: No  Feeding    LATCH Score/Interventions                      Lactation Tools Discussed/Used Tools: Flanges Flange Size:  (mom decreased to 21 flanges with good fit) WIC Program: Yes (fax sent for DEP) Pump Review: Setup, frequency, and cleaning;Milk Storage;Other (comment) (opremie setting, hand expression, review of NICU booklet) Initiated by:: Franky Macho, RN,IBCLC Date initiated:: 12/30/14   Consult Status Consult Status: Follow-up Date: 12/31/14 Follow-up type: In-patient    Tonna Corner 12/30/2014, 2:53 PM

## 2014-12-31 LAB — CBC
HCT: 25.3 % — ABNORMAL LOW (ref 36.0–46.0)
Hemoglobin: 8.7 g/dL — ABNORMAL LOW (ref 12.0–15.0)
MCH: 30.6 pg (ref 26.0–34.0)
MCHC: 34.4 g/dL (ref 30.0–36.0)
MCV: 89.1 fL (ref 78.0–100.0)
PLATELETS: 188 10*3/uL (ref 150–400)
RBC: 2.84 MIL/uL — ABNORMAL LOW (ref 3.87–5.11)
RDW: 14 % (ref 11.5–15.5)
WBC: 21.4 10*3/uL — ABNORMAL HIGH (ref 4.0–10.5)

## 2014-12-31 LAB — RPR: RPR: NONREACTIVE

## 2014-12-31 NOTE — Progress Notes (Signed)
Patient is doing well.  She is tolerating PO, ambulating, voiding.  Pain is controlled.  Lochia is appropriate Currently pumping  Filed Vitals:   12/30/14 2100 12/30/14 2200 12/31/14 0238 12/31/14 0519  BP: 92/39 103/64 88/56 90/36   Pulse: 67 72 60 78  Temp: 99.2 F (37.3 C)  99.1 F (37.3 C) 98.4 F (36.9 C)  TempSrc:   Oral Oral  Resp: 18 20 20 20   Height:      Weight:      SpO2: 96% 99% 100% 95%    NAD Abdomen:  soft, appropriate tenderness, incisions intact and without erythema or drainage ext:    Symmetric, no edema bilaterally  Lab Results  Component Value Date   WBC 21.4* 12/31/2014   HGB 8.7* 12/31/2014   HCT 25.3* 12/31/2014   MCV 89.1 12/31/2014   PLT 188 12/31/2014    --/--/O POS (10/14 0825)/RImmune  A/P    24 y.o. G1P0102 POD #1 s/p primary LTCS  Routine post op and postpartum care.   Babies in NICU, doing well per mom's reports

## 2015-01-01 NOTE — Progress Notes (Signed)
Patient is doing well.  She is tolerating PO, ambulating, voiding.  Pain is controlled.  Lochia is appropriate Pumping going well  Filed Vitals:   12/31/14 1337 12/31/14 1755 12/31/14 2103 01/01/15 0541  BP: 101/46 89/46 107/62 109/55  Pulse: 57 77 82 81  Temp: 97.6 F (36.4 C) 98.2 F (36.8 C) 98.9 F (37.2 C) 98.8 F (37.1 C)  TempSrc: Oral Oral Oral Oral  Resp: 20 18 20 18   Height:      Weight:      SpO2: 100% 100% 99% 100%    NAD Abdomen:  soft, appropriate tenderness, incisions intact and without erythema or drainage ext:    Symmetric, no edema bilaterally  Lab Results  Component Value Date   WBC 21.4* 12/31/2014   HGB 8.7* 12/31/2014   HCT 25.3* 12/31/2014   MCV 89.1 12/31/2014   PLT 188 12/31/2014    --/--/O POS (10/14 0825)/RImmune  A/P    24 y.o. G1P0102 POD #2 s/p primary LTCS  Routine post op and postpartum care.   Babies in NICU, doing well per mom's reports

## 2015-01-01 NOTE — Lactation Note (Signed)
This note was copied from the chart of [place]. Lactation Consultation Note  Patient Name: Katherine Mendez TYOMA'Y Date: 01/01/2015 Reason for consult: Follow-up assessment;NICU baby;Multiple gestation  Per mom has been pumping very 3 hours with the DEBP , with increasing amounts.  Per mom base of the nipple tender and was using the #21 flanges and switched up to the #24 Flanges  It feels better . LC recommended hand express and apply EBM to the nipples and areolas prior to latching.  Continue to use the #24 Flanges. If a family member could bring coconut oil to the hospital , to apply small amount on the  Nipples and areolas prior to pumping to decrease the discomfort.  If using  A very small amount , it won't interfere with using the comfort gels afterwards.     Maternal Data Has patient been taught Hand Expression?: Yes Does the patient have breastfeeding experience prior to this delivery?: No  Feeding Feeding Type: Breast Milk Length of feed: 30 min  LATCH Score/Interventions                      Lactation Tools Discussed/Used Tools: Pump Flange Size: 24 (per mom was using the #21 Flanges , andthe base of the nipple was getting sore ) Breast pump type: Double-Electric Breast Pump   Consult Status Consult Status: Follow-up Date: 01/02/15 Follow-up type: In-patient    Myer Haff 01/01/2015, 5:54 PM

## 2015-01-01 NOTE — Clinical Social Work Maternal (Signed)
CLINICAL SOCIAL WORK MATERNAL/CHILD NOTE  Patient Details  Name: Katherine Mendez MRN: [id number] Date of Birth: 10/06/1990  Date: 01/01/2015  Clinical Social Worker Initiating Note: Uriel Dowding, LCSWDate/ Time Initiated: 01/01/15/1000   Child's Name: Boy A: Bryce Christian Bolduc and Boy B: Brynton Christopher Hata   Legal Guardian: Mother   Need for Interpreter: None   Date of Referral: 01/01/15   Reason for Referral:     Referral Source: NICU   Address: [address] [place], [place] [address]  Phone number:  ([phone])   Household Members: Relatives   Natural Supports (not living in the home): Extended Family, Friends   Professional Supports: (Family Parternership Program)   Employment: (Currently on leave from work)   Type of Work:     Education: College graduate   Financial Resources:Medicaid   Other Resources: Food Stamps , WIC   Cultural/Religious Considerations Which May Impact Care: none noted Strengths: Ability to meet basic needs  (Mother has been preparing the home throughout pregnancy)   Risk Factors/Current Problems: Family/Relationship Issues    Cognitive State: Able to Concentrate , Alert    Mood/Affect: Happy    CSW Assessment: Met with mother who was pleasant and receptive to social work intervention. She's known to this writer. She has been hospitalized since 9/14 and CSW met with her several times for support. She is a single parent with no other dependents. FOB is uninvolved. Mother has reached out to him several times with no response. It is unclear whether his family is was aware of her pregnancy. She has extensive support from maternal family and friends and is linked with community resources. MOB seems to be coping well with the twins NICU admission. Informed that she have spoken with the medical team and was told that the twins are stable and breathing on their  own. Mother is excited about the twins but reports sadness at times because of FOB's lack of involvement and support. Provided supportive feedback and allowed her to vent. No acute social concerns related at this time. CSW will follow for support PRN. CSW Plan/Description: Psychosocial Support and Ongoing Assessment of Needs ;  Spoke with mother regarding applying for SSI  Tamaka Sawin J, LCSW 01/01/2015, 4:16 PM   CLINICAL SOCIAL WORK MATERNAL/CHILD NOTE  Patient Details  Name: Katherine Mendez MRN: [id number] Date of Birth: 01/31/1991  Date:  01/01/2015  Clinical Social Worker Initiating Note:  Norlene Duel, LCSW Date/ Time Initiated:  01/01/15/1000     Child's Name:  Boy A: Luetta Nutting and Boy B: Clenton Pare   Legal Guardian:  Mother   Need for Interpreter:  None   Date of Referral:  01/01/15     Reason for Referral:      Referral Source:  NICU   Address:  [address] [place], [place] [address]  Phone number:   [phone])   Household Members:  Relatives   Natural Supports (not living in the home):  Extended Family, Friends   Medical illustrator Supports:  (Family Parternership Program)   Employment:  (Currently on leave from work)   Type of Work:     Education:  Engineer, maintenance Resources:  [employer]   Other Resources:  Physicist, medical , [place] Considerations Which May Impact Care:  none noted Strengths:  Ability to meet basic needs  (Mother has been preparing the home throughout pregnancy)   Risk Factors/Current Problems:  Family/Relationship Issues    Cognitive State:  Able to Concentrate , Alert    Mood/Affect:  Happy    CSW Assessment: Met with mother who was pleasant and receptive to social work intervention.  She's known to this Probation officer.   She has been hospitalized since 9/14 and CSW met with her several times for support.   She is a single parent with no other dependents.  FOB is uninvolved.  Mother has reached out to him several times with no response.  It is unclear whether his family is was aware of her pregnancy.  She has extensive support from maternal family and friends and is linked with community resources.  MOB seems to be coping well with the twins NICU admission.  Informed that she have spoken with the medical team and was told that the twins are stable and breathing on their own.   Mother is  excited about the twins but reports sadness at times because of FOB's lack of involvement and support.  Provided supportive feedback and allowed her to vent.  No acute social concerns related at this time.  CSW will follow for support PRN. CSW Plan/Description:  Psychosocial Support and Ongoing Assessment of Needs ;  Spoke with mother regarding applying for SSI  Jomel Whittlesey J, LCSW 01/01/2015, 4:16 PM

## 2015-01-02 ENCOUNTER — Encounter (HOSPITAL_COMMUNITY): Payer: Self-pay | Admitting: Obstetrics

## 2015-01-02 MED ORDER — DOCUSATE SODIUM 100 MG PO CAPS: 100.0000 mg | ORAL_CAPSULE | Freq: Two times a day (BID) | ORAL | Status: DC

## 2015-01-02 MED ORDER — IBUPROFEN 600 MG PO TABS: 600.0000 mg | ORAL_TABLET | Freq: Four times a day (QID) | ORAL | Status: DC | PRN

## 2015-01-02 MED ORDER — OXYCODONE-ACETAMINOPHEN 5-325 MG PO TABS: 1.0000 | ORAL_TABLET | ORAL | Status: DC | PRN

## 2015-01-02 NOTE — Discharge Summary (Signed)
Obstetric Discharge Summary Reason for Admission: Preterm labor Prenatal Procedures: NST and ultrasound Intrapartum Procedures: vaginal delivery, and cesarean: low cervical, transverse Postpartum Procedures: none Complications-Operative and Postpartum: None HEMOGLOBIN  Date Value Ref Range Status  12/31/2014 8.7* 12.0 - 15.0 g/dL Final   HCT  Date Value Ref Range Status  12/31/2014 25.3* 36.0 - 46.0 % Final    Physical Exam:  General: alert, cooperative and appears stated age 24: appropriate Uterine Fundus: firm Incision: healing well DVT Evaluation: No evidence of DVT seen on physical exam.  Discharge Diagnoses: Premature labor and PROM x10 days hours, vaginal delivery of baby A, cesarean delivery baby b  Discharge Information: Date: 01/02/2015 Activity: pelvic rest Diet: routine Medications: Ibuprofen, colace, percocet Condition: improved Instructions: refer to practice specific booklet Discharge to: home Follow-up Information    Follow up with [facility] Carlis Abbott, MD In 4 weeks.   Specialty:  Obstetrics   Why:  For a postpartum evaluation   Contact information:   [place] [place] [address] [phone]       Newborn Data:   Terasa, Orsini [[id number]]  Live born female  Birth Weight: 2 lb 11.7 oz (1240 g) APGAR: 2, 7   Eloina, Ergle [[id number]]  Live born female  Birth Weight: 2 lb 13.2 oz (1280 g) APGAR: 5, 7  NICU.  Colbey Wirtanen H. 01/02/2015, 7:30 PM

## 2015-01-02 NOTE — Lactation Note (Signed)
This note was copied from the chart of [place]. Lactation Consultation Note  Patient Name: Katherine Mendez GYKZL'D Date: 01/02/2015 Reason for consult: Follow-up assessment;NICU baby NICU twins 34 hours old, [redacted]w[redacted]d CGA. Mom states that she is getting colostrum and taking to twins in NICU. Enc mom to call [facility]sie about getting a pump. Mom aware of [facility] loaner program if needed. Mom given [facility] phone number as well. Enc mom to call for [facility] to get a loaner pump if needed depending on when her appointment with [facility] will be. Mom aware of pumping rooms in NICU. Enc mom to pump 8 times/24 hours for 15 minutes. Enc mom to hand express after pumping as well.   Maternal Data    Feeding Feeding Type: Breast Milk  LATCH Score/Interventions                      Lactation Tools Discussed/Used     Consult Status Consult Status: Follow-up Date: 01/03/15 Follow-up type: In-patient    Inocente Salles 01/02/2015, 10:56 AM

## 2015-01-02 NOTE — Progress Notes (Signed)
Subjective: Postpartum Day 3: Cesarean Delivery Patient reports pain well controlled, tolerating po, no nausea or vomiting, voiding without difficulty. Babies are in NICU doing well  Objective: Vital signs in last 24 hours: Temp:  [98.2 F (36.8 C)-98.9 F (37.2 C)] 98.2 F (36.8 C) (10/17 1835) Pulse Rate:  [59-73] 73 (10/17 1835) Resp:  [16-20] 20 (10/17 1835) BP: (90-105)/(50-69) 105/69 mmHg (10/17 1835) SpO2:  [100 %] 100 % (10/17 1835)  Physical Exam:  General: alert, cooperative and appears stated age 24: appropriate Uterine Fundus: firm Incision: healing well DVT Evaluation: No evidence of DVT seen on physical exam.   Recent Labs  12/31/14 0531  HGB 8.7*  HCT 25.3*    Assessment/Plan: Status post Cesarean section. Doing well postoperatively.  Continue current care. Pt interested in going home this evening. Pt is pumping but does not have a pump at home. Advised that going home tonight may be problematic if she gets engorged in the middle of the night. Pt is willing to go to [facility] at midnight to get a pump if necessary. Will write her a discharge order. If she changes her mind can cancel  Idalia Allbritton H. 01/02/2015, 7:23 PM

## 2015-01-02 NOTE — Progress Notes (Signed)
Follow up with patient in hallway as she was on the way down to the NICU to see Ezzard Flax and Izola Price.  Ms Safi states she is doing okay and just a little worried about the boys. Will continue to follow.     01/02/15 1120  Clinical Encounter Type  Visited With Patient  Visit Type Follow-up  Spiritual Encounters  Spiritual Needs Emotional  Stress Factors  Patient Stress Factors Major life changes

## 2015-01-02 NOTE — Progress Notes (Signed)
Follow up with patient after the delivery of her babies.  Pt was drowsy after having had a c-section.  Had further conversation with the patient's mother in the NICU.  She states that [name] is very strong and independent and that the babies' father is not in the picture.  She reports she is encouraging [name] to lean on her family for support and hopes that she will do so so that she can have the support she needs to be all she can for her boys.    12/30/14 1530  Clinical Encounter Type  Visited With Patient and family together  Visit Type Follow-up  Spiritual Encounters  Spiritual Needs Emotional  Stress Factors  Patient Stress Factors Loss of control  Family Stress Factors Loss of control

## 2015-01-03 NOTE — Progress Notes (Signed)
Discharge teaching complete. Pt ambulated out of the hospital and discharged home to family.

## 2015-01-03 NOTE — Lactation Note (Signed)
This note was copied from the chart of [place]. Lactation Consultation Note  Patient Name: Katherine Mendez NOIBB'C Date: 01/03/2015 Reason for consult: Follow-up assessment;NICU baby;Infant < 6lbs   Mom to be D/C home today. She has been pumping EBM for twin infants in NICU. This morning she pumped BM and is taking to infants. Mom with mild engorgement this morning. Encouraged her to pump every 2-3 hours massaging breast prior to pumping. Discussed Engorgement protocol/prevention. Mom reports she is sleeping through the night also. She is planning to call [facility] to set up appointment for pump rental. Left mom my phone ton call me once she speaks to [facility] if she needs a pump rental prior to D/C. [place] Referral to be sent.   Maternal Data Formula Feeding for Exclusion: No  Feeding Feeding Type: Breast Milk Length of feed: 30 min  LATCH Score/Interventions                      Lactation Tools Discussed/Used WIC Program: Yes   Consult Status Consult Status: PRN Follow-up type: Call as needed    Debby Freiberg Marcelles Clinard 01/03/2015, 10:00 AM

## 2016-08-14 ENCOUNTER — Other Ambulatory Visit: Payer: Self-pay | Admitting: Obstetrics

## 2016-08-16 LAB — CYTOLOGY - PAP

## 2016-10-26 ENCOUNTER — Encounter (HOSPITAL_COMMUNITY): Payer: Self-pay

## 2016-10-26 NOTE — ED Triage Notes (Signed)
Seen at [facility] today for abdominal pain and nausea and vomiting given fluids and now states she is worse again.

## 2016-10-27 ENCOUNTER — Emergency Department (HOSPITAL_COMMUNITY)
Admission: EM | Admit: 2016-10-27 | Discharge: 2016-10-27 | Discharge status: Against Medical Advice | Payer: Medicaid Other | Attending: Emergency Medicine | Admitting: Emergency Medicine

## 2016-10-27 DIAGNOSIS — Z5321 Procedure and treatment not carried out due to patient leaving prior to being seen by health care provider: Secondary | ICD-10-CM | POA: Insufficient documentation

## 2016-10-27 DIAGNOSIS — R1084 Generalized abdominal pain: Secondary | ICD-10-CM | POA: Insufficient documentation

## 2016-12-09 IMAGING — US US MFM OB FOLLOW-UP EACH ADDL GEST (MODIFY)
1 series · 12 of 28 positions shown · non-contrast
Comparison: none

[Series 1: us mfm ob follow-up each addl gest (modify) · 100 acquisitions, 12 frames shown]
[im 4/100]
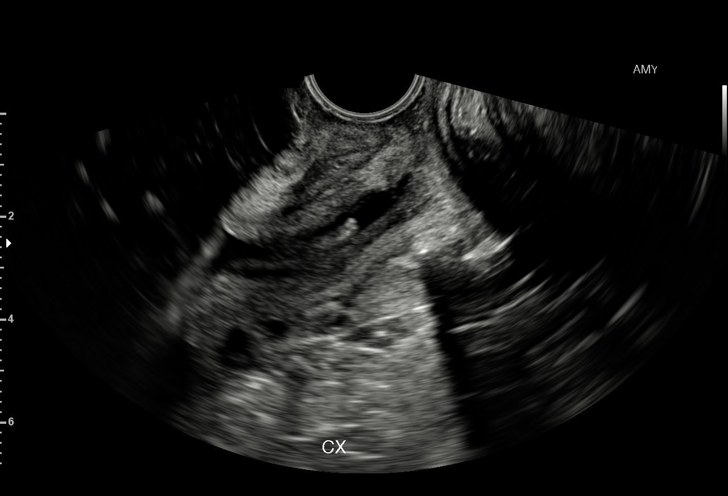
[im 12/100]
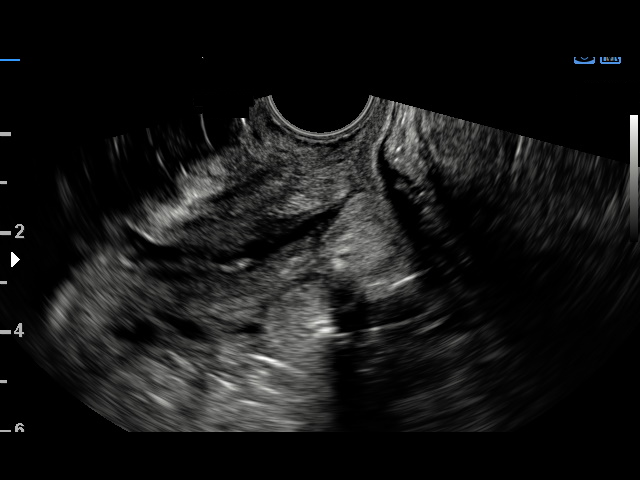
[im 19/100]
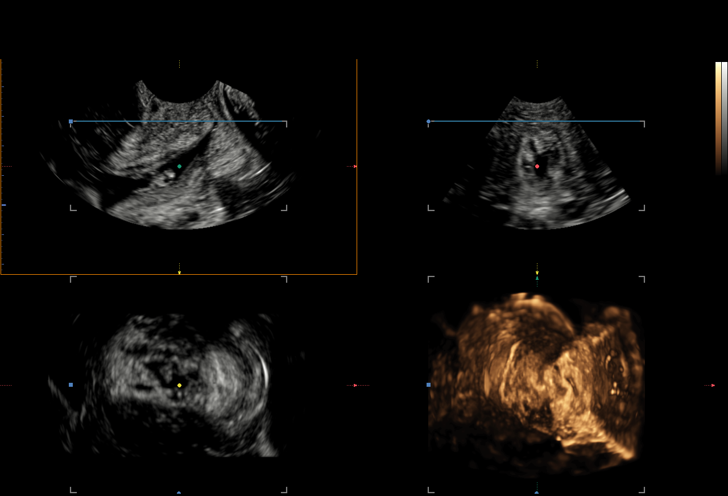
[im 30/100]
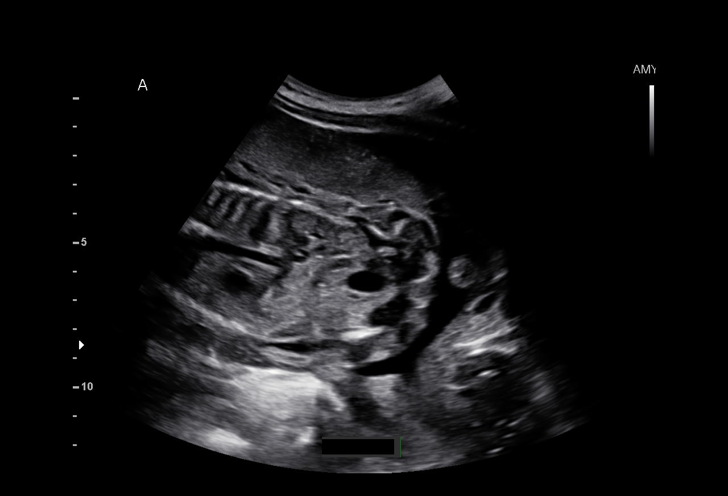
[im 37/100]
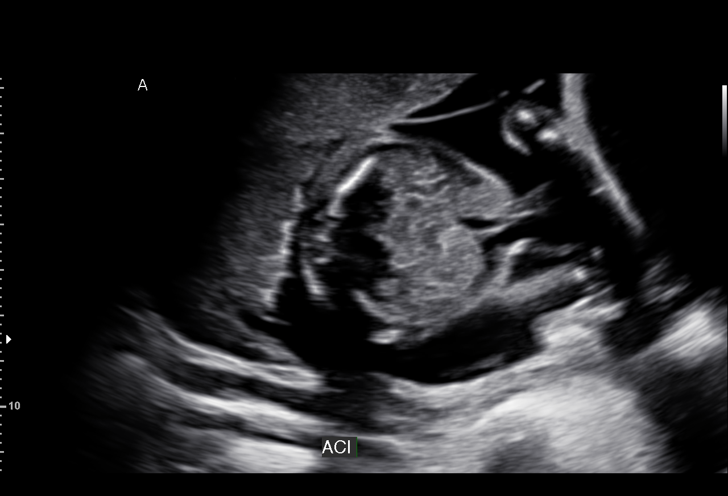
[im 45/100]
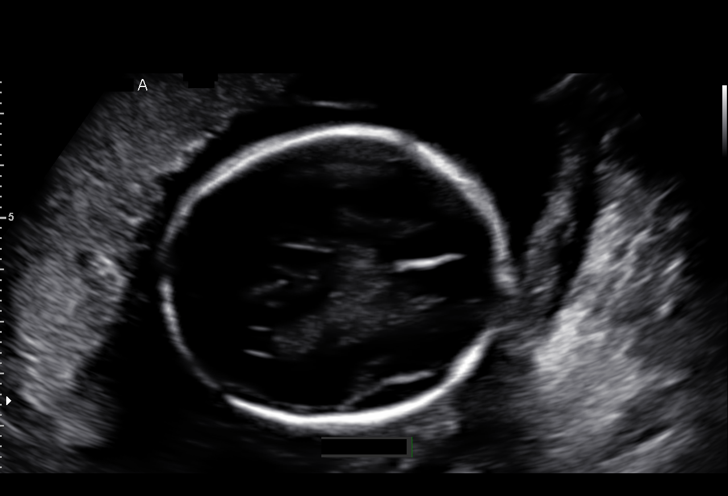
[im 56/100]
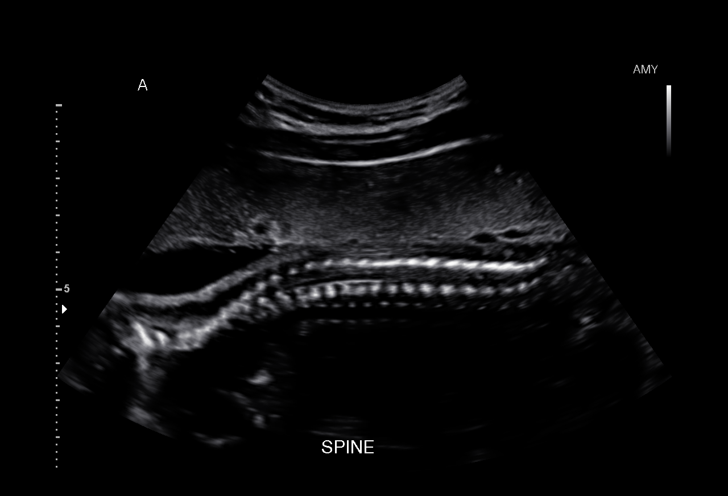
[im 63/100]
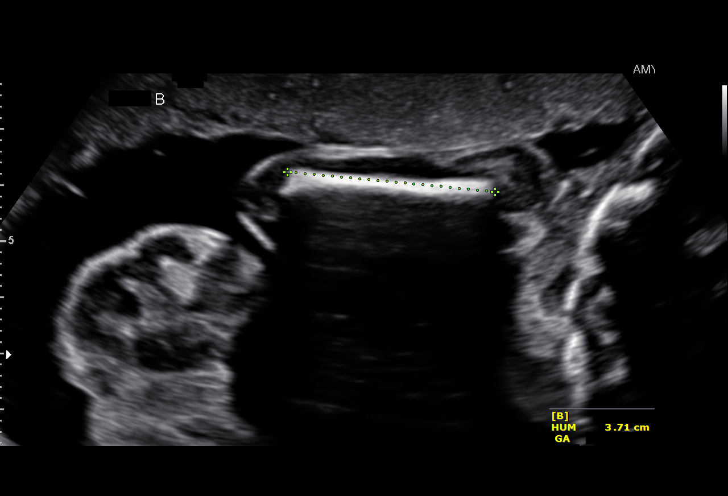
[im 70/100]
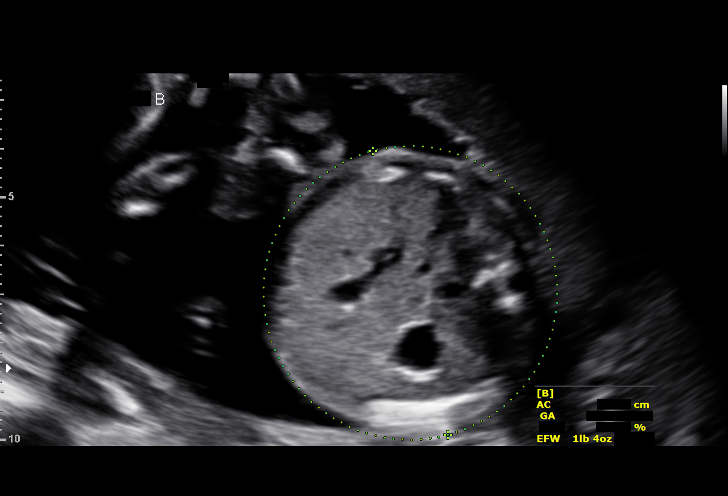
[im 81/100]
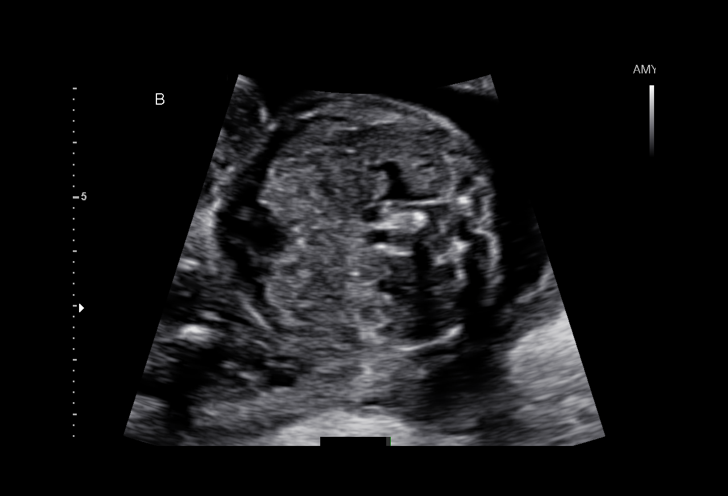
[im 89/100]
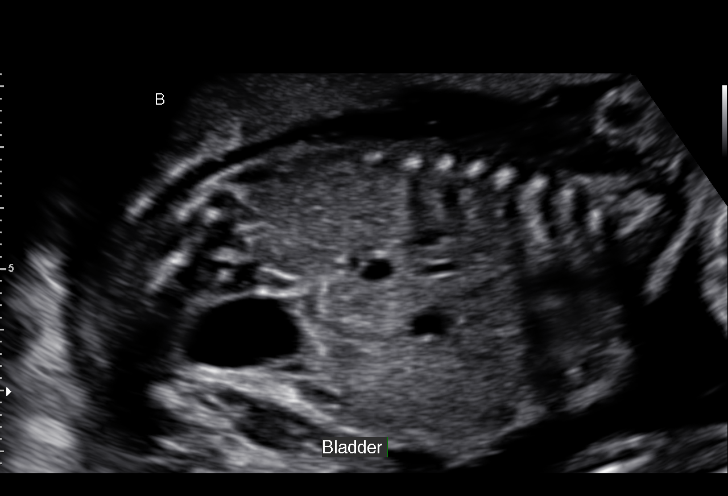
[im 96/100]
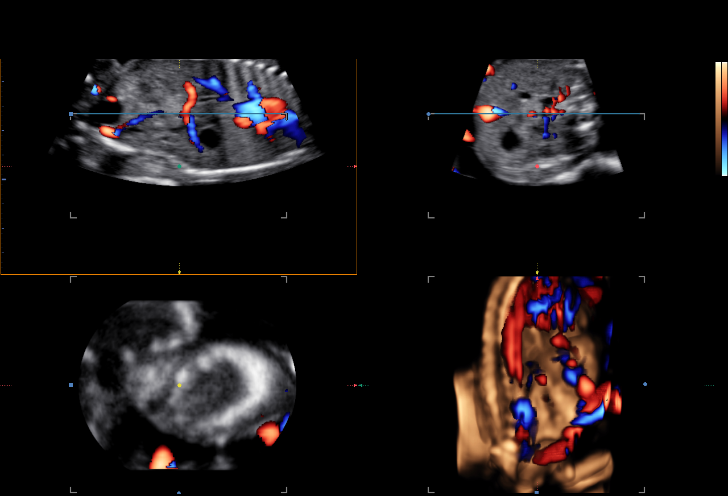

[12 of 28 positions shown; findings below may reference images not displayed]

OBSTETRICS REPORT
(Signed Final 11/17/2014 [DATE])

Date:

Service(s) Provided

US MFM OB TRANSVAGINAL                                 76817.2
Indications

Twin gestation, Mqoxbpl
Cervical shortening, 2nd; pessary in place
22 weeks gestation of pregnancy
Fetal Evaluation (Fetus A)

Num Of             2
Fetuses:
Fetal Heart        162                          bpm
Rate:
Cardiac Activity:  Observed
Fetal Lie:         Lower right Fetus
Presentation:      Breech
Placenta:          Anterior, above cervical
os
P. Cord            Marginal ins on RT prev
Insertion:         seen

Membrane           Dividing
Desc:              Membrane seen
- Monochorionic

Amniotic Fluid
AFI FV:      Subjectively within normal limits
Larg Pckt:      4.2  cm
Biometry (Fetus A)

BPD:     54.8   m    G. Age:   22w 5d                 CI:        73.56   70 - 86
m
FL/HC:      19.6   19.2 -
20.8
HC:       203   m    G. Age:   22w 3d        32  %    HC/AC:      1.12   1.05 -
m
AC:     181.9   m    G. Age:   23w 0d        57  %    FL/BPD      72.6   71 - 87
m                                     :
FL:      39.8   m    G. Age:   22w 6d        48  %    FL/AC:      21.9   20 - 24
m
HUM:       38   m    G. Age:   23w 3d        65  %
m

Est.         543   gm    1 lb 3 oz      56   %    FW Discordancy          3  %
FW:
Gestational Age (Fetus A)

LMP:           22w 4d        Date:  06/12/14                  EDD:   03/19/15
U/S Today:     22w 5d                                         EDD:   03/18/15
Best:          22w 4d    Det. By:   LMP  (06/12/14)           EDD:   03/19/15
Anatomy (Fetus A)

Cranium:          Appears normal         Aortic Arch:       Previously seen
Fetal Cavum:      Appears normal         Ductal Arch:       Previously seen
Ventricles:       Appears normal         Diaphragm:         Appears normal
Choroid Plexus:   Previously seen        Stomach:           Appears normal,
left sided
Cerebellum:       Appears normal         Abdomen:           Appears normal
Posterior         Appears normal         Abdominal          Appears nml (cord
Fossa:                                   Wall:              insert, abd wall)
Nuchal Fold:      Previously seen        Cord Vessels:      Previously seen
Face:             Orbits and profile     Kidneys:           Appear normal
previously seen
Lips:             Previously seen        Bladder:           Appears normal
Heart:            Appears normal         Spine:             Appears normal
(4CH, axis, and
situs)
RVOT:             Previously seen        Lower              Previously seen
Extremities:
LVOT:             Previously seen        Upper              Previously seen
Extremities:

Other:   Male gender. Heels and 5th digit previously seen.

Fetal Evaluation (Fetus B)

Num Of             2
Fetuses:
Fetal Heart        142                          bpm
Rate:
Cardiac Activity:  Observed
Fetal Lie:         Upper left Fetus
Presentation:      Cephalic
Placenta:          Anterior, above cervical
os
P. Cord            Previously Visualized
Insertion:

Membrane           Dividing
Desc:              Membrane seen
- Monochorionic
Amniotic Fluid
AFI FV:      Subjectively within normal limits
Larg Pckt:      4.8  cm
Biometry (Fetus B)

BPD:     53.6   m    G. Age:   22w 2d                 CI:        69.63   70 - 86
m
FL/HC:      19.0   19.2 -
20.8
HC:       205   m    G. Age:   22w 4d        39  %    HC/AC:      1.08   1.05 -
m
AC:     189.8   m    G. Age:   23w 5d        77  %    FL/BPD      72.6   71 - 87
m                                     :
FL:      38.9   m    G. Age:   22w 3d        37  %    FL/AC:      20.5   20 - 24
m
HUM:     37.1   m    G. Age:   23w 0d        54  %
m

Est.         562   gm    1 lb 4 oz      59   %    FW Discordancy       0 \ 3 %
FW:
Gestational Age (Fetus B)

LMP:           22w 4d        Date:  06/12/14                  EDD:   03/19/15
U/S Today:     22w 5d                                         EDD:   03/18/15
Best:          22w 4d    Det. By:   LMP  (06/12/14)           EDD:   03/19/15
Anatomy (Fetus B)

Cranium:          Previously seen        Aortic Arch:       Previously seen
Fetal Cavum:      Previously seen        Ductal Arch:       Previously seen
Ventricles:       Appears normal         Diaphragm:         Appears normal
Choroid Plexus:   Previously seen        Stomach:           Appears normal,
left sided
Cerebellum:       Appears normal         Abdomen:           Appears normal
Posterior         Appears normal         Abdominal          Appears nml (cord
Fossa:                                   Wall:              insert, abd wall)
Nuchal Fold:      Previously seen        Cord Vessels:      Appears normal (3
vessel cord)
Face:             Orbits and profile     Kidneys:           Appear normal
previously seen
Lips:             Previously seen        Bladder:           Appears normal
Heart:            Appears normal         Spine:             Previously seen
(4CH, axis, and
situs)
RVOT:             Previously seen        Lower              Previously seen
Extremities:
LVOT:             Previously seen        Upper              Previously seen
Extremities:

Other:   Male gender. Heels and 5th digit previously seen.
Cervix Uterus Adnexa

Cervical Length:    0.4       cm

Cervix:       Measured transvaginally.
Impression

Monochorionic/diamniotic twin pregnancy at 22+4 weeks
Normal interval anatomy; anatomic survey complete x 2
Normal amniotic fluid volume x 2
Appropriate interval growths with EFWs at the 56th and
59th %tile
EV views of cervix: very narrow V-shaped funneling with
shortest distal closed portion measuring 4 mm; pessary
shadowing visualized
No evidence of TTTS
Recommendations

Continue vaginal progesterone
Follow-up ultrasound in 2 weeks to reassess AFVs for
TTTS surveillance and to measure CL

## 2017-05-06 ENCOUNTER — Other Ambulatory Visit: Payer: Self-pay

## 2017-05-06 ENCOUNTER — Encounter (HOSPITAL_BASED_OUTPATIENT_CLINIC_OR_DEPARTMENT_OTHER): Payer: Self-pay | Admitting: Emergency Medicine

## 2017-05-06 ENCOUNTER — Emergency Department (HOSPITAL_BASED_OUTPATIENT_CLINIC_OR_DEPARTMENT_OTHER)
Admission: EM | Admit: 2017-05-06 | Discharge: 2017-05-06 | Disposition: A | Discharge status: Home / Self Care | Payer: Medicaid Other | Attending: Emergency Medicine | Admitting: Emergency Medicine

## 2017-05-06 DIAGNOSIS — L02411 Cutaneous abscess of right axilla: Secondary | ICD-10-CM | POA: Diagnosis not present

## 2017-05-06 DIAGNOSIS — Z87891 Personal history of nicotine dependence: Secondary | ICD-10-CM | POA: Diagnosis not present

## 2017-05-06 DIAGNOSIS — L0291 Cutaneous abscess, unspecified: Secondary | ICD-10-CM

## 2017-05-06 DIAGNOSIS — Z79899 Other long term (current) drug therapy: Secondary | ICD-10-CM | POA: Insufficient documentation

## 2017-05-06 DIAGNOSIS — R2231 Localized swelling, mass and lump, right upper limb: Secondary | ICD-10-CM | POA: Diagnosis present

## 2017-05-06 MED ORDER — DOXYCYCLINE HYCLATE 100 MG PO CAPS: 100.0000 mg | ORAL_CAPSULE | Freq: Two times a day (BID) | ORAL | 0 refills | Status: DC

## 2017-05-06 MED ORDER — LIDOCAINE HCL 2 % IJ SOLN
10.0000 mL | Freq: Once | INTRAMUSCULAR | Status: AC
  Administered 2017-05-06: 200 mg via INTRADERMAL
  Filled 2017-05-06: qty 20

## 2017-05-06 NOTE — ED Provider Notes (Signed)
[facility] Provider Note   CSN: [id number] Arrival date & time: 05/06/17  0016     History   Chief Complaint Chief Complaint  Patient presents with  . Abscess    HPI Katherine Mendez is a 27 y.o. female.  HPI  This is a 27 year old female who presents with abscess of the right axilla.  Patient reports several day history of worsening pain to the right axilla.  She noted an abscess.  She has been using them compresses.  She is not noted any drainage.  She denies any fevers or overlying skin changes.  She does have a history of abscess requiring I&D previously.  Rates her pain currently as 0.  States that it is worsened if something rubs against it.  Past Medical History:  Diagnosis Date  . Medical history non-contributory     Patient Active Problem List   Diagnosis Date Noted  . Active labor 12/30/2014  . Pregnancy 12/30/2014  . Pregnant and not yet delivered 11/30/2014    Past Surgical History:  Procedure Laterality Date  . CESAREAN SECTION N/A 12/30/2014   Procedure: CESAREAN SECTION;  Surgeon: Jerelyn Charles, MD;  Location: [place] [facility];  Service: Obstetrics;  Laterality: N/A;  Twin A delivered vaginally, Twin B delivered via stat c-section  . NO PAST SURGERIES    . VAGINAL DELIVERY N/A 12/30/2014   Procedure: VAGINAL DELIVERY;  Surgeon: Jerelyn Charles, MD;  Location: [facility];  Service: Obstetrics;  Laterality: N/A;  Twin A delivered vaginally, Twin B delivered via stat c-section    OB History    Gravida Para Term Preterm AB Living   1 1 0 1 0 2   SAB TAB Ectopic Multiple Live Births   0 0 0 1 2       Home Medications    Prior to Admission medications   Medication Sig Start Date End Date Taking? Authorizing Provider  docusate sodium (COLACE) 100 MG capsule Take 1 capsule (100 mg total) by mouth 2 (two) times daily. 01/02/15   Vanessa Kick, MD  doxycycline (VIBRAMYCIN) 100 MG capsule Take 1 capsule (100 mg total) by mouth 2 (two) times  daily. 05/06/17   Horton, Barbette Hair, MD  ibuprofen (ADVIL,MOTRIN) 600 MG tablet Take 1 tablet (600 mg total) by mouth every 6 (six) hours as needed. 01/02/15   Vanessa Kick, MD  oxyCODONE-acetaminophen (ROXICET) 5-325 MG tablet Take 1-2 tablets by mouth every 4 (four) hours as needed for severe pain. 01/02/15   Vanessa Kick, MD  Prenatal Vit-Fe Fumarate-FA (PRENATAL VITAMIN PO) Take by mouth.    [provider]    Family History Family History  Problem Relation Age of Onset  . Depression Mother   . Hypertension Maternal Grandfather   . Hypertension Paternal Grandmother     Social History Social History   Tobacco Use  . Smoking status: Former Research scientist (life sciences)  . Smokeless tobacco: Never Used  Substance Use Topics  . Alcohol use: No  . Drug use: Yes    Types: Marijuana    Comment: none with pregnancy     Allergies   Patient has no known allergies.   Review of Systems Review of Systems  Constitutional: Negative for fever.  Skin: Negative for color change and wound.       Abscess  All other systems reviewed and are negative.    Physical Exam Updated Vital Signs BP 106/77 (BP Location: Left Arm)   Pulse 68   Temp 97.9 F (36.6  C) (Oral)   Resp 20   LMP 04/15/2017   SpO2 100%   Physical Exam  Constitutional: She is oriented to person, place, and time. She appears well-developed and well-nourished.  HENT:  Head: Normocephalic and atraumatic.  Cardiovascular: Normal rate and regular rhythm.  Pulmonary/Chest: Effort normal. No respiratory distress.  Neurological: She is alert and oriented to person, place, and time.  Skin: Skin is warm and dry.  Indurated lesion with small pustule noted to the right axilla, approximately 2 x 2 cm, tender to palpation, no significant overlying erythema  Psychiatric: She has a normal mood and affect.  Nursing note and vitals reviewed.    ED Treatments / Results  Labs (all labs ordered are listed, but only abnormal results are  displayed) Labs Reviewed - No data to display  EKG  EKG Interpretation None       Radiology No results found.  Procedures Procedures (including critical care time)  Medications Ordered in ED Medications  lidocaine (XYLOCAINE) 2 % (with pres) injection 200 mg (200 mg Intradermal Given by Other 05/06/17 0209)     Initial Impression / Assessment and Plan / ED Course  I have reviewed the triage vital signs and the nursing notes.  Pertinent labs & imaging results that were available during my care of the patient were reviewed by me and considered in my medical decision making (see chart for details).     Patient presents with an abscess.  She is otherwise nontoxic appearing.  No immediate complications.  Abscess was I&Dat the bedside by Cherlynn Kaiser, PA.  See additional note for details.  Patient was provided with antibiotics.  After history, exam, and medical workup I feel the patient has been appropriately medically screened and is safe for discharge home. Pertinent diagnoses were discussed with the patient. Patient was given return precautions.   Final Clinical Impressions(s) / ED Diagnoses   Final diagnoses:  Abscess    ED Discharge Orders        Ordered    doxycycline (VIBRAMYCIN) 100 MG capsule  2 times daily     05/06/17 0211       Horton, Barbette Hair, MD 05/06/17 [phone]

## 2017-05-06 NOTE — ED Triage Notes (Signed)
Pt presents with c/o abscess to right under arm. PT took doxy form urgent care but did not finish script and now abscess is back.

## 2017-05-06 NOTE — Discharge Instructions (Signed)
You will be given antibiotics.  Monitor for increasing redness or fevers.  You may continue to use warm compresses.

## 2017-05-06 NOTE — ED Provider Notes (Signed)
..  Incision and Drainage Date/Time: 05/06/2017 2:27 AM Performed by: Volanda Napoleon, PA-C Authorized by: Volanda Napoleon, PA-C   Consent:    Consent obtained:  Verbal   Consent given by:  Patient   Risks discussed:  Incomplete drainage and bleeding   Alternatives discussed:  Delayed treatment and no treatment Location:    Type:  Abscess   Size:  2 cm   Location:  Upper extremity   Upper extremity location: Axilla. Pre-procedure details:    Skin preparation:  Betadine Anesthesia (see MAR for exact dosages):    Anesthesia method:  Local infiltration   Local anesthetic:  Lidocaine 1% w/o epi Procedure type:    Complexity:  Simple Procedure details:    Incision types:  Single straight   Scalpel blade:  11   Wound management:  Probed and deloculated and irrigated with saline   Drainage:  Purulent   Drainage amount:  Moderate   Wound treatment:  Wound left open Post-procedure details:    Patient tolerance of procedure:  Tolerated well, no immediate complications       Frankie, Zito 05/06/17 7342    Merryl Hacker, MD 05/06/17 [phone]

## 2017-06-06 ENCOUNTER — Emergency Department (HOSPITAL_BASED_OUTPATIENT_CLINIC_OR_DEPARTMENT_OTHER)
Admission: EM | Admit: 2017-06-06 | Discharge: 2017-06-06 | Disposition: A | Discharge status: Home / Self Care | Payer: Medicaid Other | Attending: Emergency Medicine | Admitting: Emergency Medicine

## 2017-06-06 ENCOUNTER — Encounter (HOSPITAL_BASED_OUTPATIENT_CLINIC_OR_DEPARTMENT_OTHER): Payer: Self-pay

## 2017-06-06 DIAGNOSIS — R1110 Vomiting, unspecified: Secondary | ICD-10-CM | POA: Diagnosis present

## 2017-06-06 DIAGNOSIS — R197 Diarrhea, unspecified: Secondary | ICD-10-CM

## 2017-06-06 DIAGNOSIS — Z87891 Personal history of nicotine dependence: Secondary | ICD-10-CM | POA: Insufficient documentation

## 2017-06-06 DIAGNOSIS — Z79899 Other long term (current) drug therapy: Secondary | ICD-10-CM | POA: Insufficient documentation

## 2017-06-06 DIAGNOSIS — R112 Nausea with vomiting, unspecified: Secondary | ICD-10-CM

## 2017-06-06 DIAGNOSIS — E860 Dehydration: Secondary | ICD-10-CM | POA: Diagnosis not present

## 2017-06-06 LAB — CBC WITH DIFFERENTIAL/PLATELET
BASOS PCT: 0 %
Basophils Absolute: 0 10*3/uL (ref 0.0–0.1)
EOS ABS: 0 10*3/uL (ref 0.0–0.7)
Eosinophils Relative: 0 %
HCT: 44 % (ref 36.0–46.0)
HEMOGLOBIN: 15 g/dL (ref 12.0–15.0)
Lymphocytes Relative: 2 %
Lymphs Abs: 0.3 10*3/uL — ABNORMAL LOW (ref 0.7–4.0)
MCH: 30.3 pg (ref 26.0–34.0)
MCHC: 34.1 g/dL (ref 30.0–36.0)
MCV: 88.9 fL (ref 78.0–100.0)
Monocytes Absolute: 0.2 10*3/uL (ref 0.1–1.0)
Monocytes Relative: 1 %
NEUTROS PCT: 97 %
Neutro Abs: 16.2 10*3/uL — ABNORMAL HIGH (ref 1.7–7.7)
Platelets: 199 10*3/uL (ref 150–400)
RBC: 4.95 MIL/uL (ref 3.87–5.11)
RDW: 12 % (ref 11.5–15.5)
WBC: 16.7 10*3/uL — ABNORMAL HIGH (ref 4.0–10.5)

## 2017-06-06 LAB — COMPREHENSIVE METABOLIC PANEL
ALBUMIN: 5.3 g/dL — AB (ref 3.5–5.0)
ALK PHOS: 60 U/L (ref 38–126)
ALT: 14 U/L (ref 14–54)
AST: 34 U/L (ref 15–41)
Anion gap: 11 (ref 5–15)
BUN: 19 mg/dL (ref 6–20)
CO2: 23 mmol/L (ref 22–32)
Calcium: 9.3 mg/dL (ref 8.9–10.3)
Chloride: 103 mmol/L (ref 101–111)
Creatinine, Ser: 0.87 mg/dL (ref 0.44–1.00)
GFR calc Af Amer: >60 mL/min (ref >60)
GFR calc non Af Amer: >60 mL/min (ref >60)
GLUCOSE: 129 mg/dL — AB (ref 65–99)
POTASSIUM: 3.9 mmol/L (ref 3.5–5.1)
SODIUM: 137 mmol/L (ref 135–145)
Total Bilirubin: 1.5 mg/dL — ABNORMAL HIGH (ref 0.3–1.2)
Total Protein: 9.2 g/dL — ABNORMAL HIGH (ref 6.5–8.1)

## 2017-06-06 LAB — URINALYSIS, ROUTINE W REFLEX MICROSCOPIC
Bilirubin Urine: NEGATIVE
Glucose, UA: NEGATIVE mg/dL
KETONES UR: 40 mg/dL — AB
LEUKOCYTES UA: NEGATIVE
NITRITE: NEGATIVE
PROTEIN: NEGATIVE mg/dL
Specific Gravity, Urine: 1.02 (ref 1.005–1.030)
pH: 8.5 — ABNORMAL HIGH (ref 5.0–8.0)

## 2017-06-06 LAB — PREGNANCY, URINE: Preg Test, Ur: NEGATIVE

## 2017-06-06 LAB — URINALYSIS, MICROSCOPIC (REFLEX)

## 2017-06-06 LAB — LIPASE, BLOOD: Lipase: 26 U/L (ref 11–51)

## 2017-06-06 MED ORDER — ONDANSETRON 4 MG PO TBDP: 4.0000 mg | ORAL_TABLET | Freq: Three times a day (TID) | ORAL | 0 refills | Status: DC | PRN

## 2017-06-06 MED ORDER — ONDANSETRON HCL 4 MG/2ML IJ SOLN
INTRAMUSCULAR | Status: AC
  Administered 2017-06-06: 4 mg
  Filled 2017-06-06: qty 2

## 2017-06-06 MED ORDER — SODIUM CHLORIDE 0.9 % IV BOLUS (SEPSIS)
1000.0000 mL | Freq: Once | INTRAVENOUS | Status: AC
  Administered 2017-06-06: 1000 mL via INTRAVENOUS

## 2017-06-06 NOTE — ED Triage Notes (Signed)
Pt c/o waking up with n/v/d multiple times; states her child has v/d

## 2017-06-06 NOTE — ED Provider Notes (Signed)
[facility] EMERGENCY DEPARTMENT Provider Note   CSN: [id number] Arrival date & time: 06/06/17  0932     History   Chief Complaint Chief Complaint  Patient presents with  . Emesis    HPI Katherine Mendez is a 27 y.o. female.  Pt presents to the ED today with n/v/d that started yesterday.  The pt said her child has similar sx a few days ago, but he is better now.  She has some upper abdominal pain.  No f/c.      Past Medical History:  Diagnosis Date  . Medical history non-contributory     Patient Active Problem List   Diagnosis Date Noted  . Active labor 12/30/2014  . Pregnancy 12/30/2014  . Pregnant and not yet delivered 11/30/2014    Past Surgical History:  Procedure Laterality Date  . CESAREAN SECTION N/A 12/30/2014   Procedure: CESAREAN SECTION;  Surgeon: Jerelyn Charles, MD;  Location: [facility];  Service: Obstetrics;  Laterality: N/A;  Twin A delivered vaginally, Twin B delivered via stat c-section  . NO PAST SURGERIES    . VAGINAL DELIVERY N/A 12/30/2014   Procedure: VAGINAL DELIVERY;  Surgeon: Jerelyn Charles, MD;  Location: [facility];  Service: Obstetrics;  Laterality: N/A;  Twin A delivered vaginally, Twin B delivered via stat c-section    OB History    Gravida  1   Para  1   Term  0   Preterm  1   AB  0   Living  2     SAB  0   TAB  0   Ectopic  0   Multiple  1   Live Births  2            Home Medications    Prior to Admission medications   Medication Sig Start Date End Date Taking? Authorizing Provider  docusate sodium (COLACE) 100 MG capsule Take 1 capsule (100 mg total) by mouth 2 (two) times daily. 01/02/15   Vanessa Kick, MD  doxycycline (VIBRAMYCIN) 100 MG capsule Take 1 capsule (100 mg total) by mouth 2 (two) times daily. 05/06/17   Horton, Barbette Hair, MD  ibuprofen (ADVIL,MOTRIN) 600 MG tablet Take 1 tablet (600 mg total) by mouth every 6 (six) hours as needed. 01/02/15   Vanessa Kick, MD  ondansetron (ZOFRAN ODT) 4 MG  disintegrating tablet Take 1 tablet (4 mg total) by mouth every 8 (eight) hours as needed. 06/06/17   Isla Pence, MD  oxyCODONE-acetaminophen (ROXICET) 5-325 MG tablet Take 1-2 tablets by mouth every 4 (four) hours as needed for severe pain. 01/02/15   Vanessa Kick, MD  Prenatal Vit-Fe Fumarate-FA (PRENATAL VITAMIN PO) Take by mouth.    [provider]    Family History Family History  Problem Relation Age of Onset  . Depression Mother   . Hypertension Maternal Grandfather   . Hypertension Paternal Grandmother     Social History Social History   Tobacco Use  . Smoking status: Former Research scientist (life sciences)  . Smokeless tobacco: Never Used  Substance Use Topics  . Alcohol use: No  . Drug use: Yes    Types: Marijuana    Comment: none with pregnancy     Allergies   Patient has no known allergies.   Review of Systems Review of Systems  Gastrointestinal: Positive for abdominal pain, diarrhea, nausea and vomiting.  All other systems reviewed and are negative.    Physical Exam Updated Vital Signs BP 94/78 (BP Location: Right Arm)  Pulse (!) 112   Temp 99 F (37.2 C)   Resp 18   LMP 04/28/2017   SpO2 98%   Physical Exam  Constitutional: She is oriented to person, place, and time. She appears well-developed and well-nourished.  HENT:  Head: Normocephalic and atraumatic.  Right Ear: External ear normal.  Left Ear: External ear normal.  Nose: Nose normal.  Mouth/Throat: Mucous membranes are dry.  Eyes: Pupils are equal, round, and reactive to light. Conjunctivae and EOM are normal.  Neck: Normal range of motion. Neck supple.  Cardiovascular: Regular rhythm, normal heart sounds and intact distal pulses. Tachycardia present.  Pulmonary/Chest: Effort normal and breath sounds normal.  Abdominal: Soft. Bowel sounds are normal.  Musculoskeletal: Normal range of motion.  Neurological: She is alert and oriented to person, place, and time.  Skin: Skin is warm. Capillary refill  takes less than 2 seconds.  Psychiatric: She has a normal mood and affect. Her behavior is normal. Judgment and thought content normal.  Nursing note and vitals reviewed.    ED Treatments / Results  Labs (all labs ordered are listed, but only abnormal results are displayed) Labs Reviewed  CBC WITH DIFFERENTIAL/PLATELET - Abnormal; Notable for the following components:      Result Value   WBC 16.7 (*)    Neutro Abs 16.2 (*)    Lymphs Abs 0.3 (*)    All other components within normal limits  COMPREHENSIVE METABOLIC PANEL - Abnormal; Notable for the following components:   Glucose, Bld 129 (*)    Total Protein 9.2 (*)    Albumin 5.3 (*)    Total Bilirubin 1.5 (*)    All other components within normal limits  URINALYSIS, ROUTINE W REFLEX MICROSCOPIC - Abnormal; Notable for the following components:   pH 8.5 (*)    Hgb urine dipstick SMALL (*)    Ketones, ur 40 (*)    All other components within normal limits  URINALYSIS, MICROSCOPIC (REFLEX) - Abnormal; Notable for the following components:   Bacteria, UA MANY (*)    Squamous Epithelial / LPF 0-5 (*)    All other components within normal limits  LIPASE, BLOOD  PREGNANCY, URINE    EKG  EKG Interpretation None       Radiology No results found.  Procedures Procedures (including critical care time)  Medications Ordered in ED Medications  ondansetron (ZOFRAN) 4 MG/2ML injection (4 mg  Given 06/06/17 1008)  sodium chloride 0.9 % bolus 1,000 mL (0 mLs Intravenous Stopped 06/06/17 1058)     Initial Impression / Assessment and Plan / ED Course  I have reviewed the triage vital signs and the nursing notes.  Pertinent labs & imaging results that were available during my care of the patient were reviewed by me and considered in my medical decision making (see chart for details).     Pt feeling much better after IVFs and zofran.  She is tolerating po fluids.  She is ready to go.  Return if worse.  Final Clinical  Impressions(s) / ED Diagnoses   Final diagnoses:  Dehydration  Nausea vomiting and diarrhea    ED Discharge Orders        Ordered    ondansetron (ZOFRAN ODT) 4 MG disintegrating tablet  Every 8 hours PRN     06/06/17 1110       Isla Pence, MD 06/06/17 1110

## 2017-09-16 DIAGNOSIS — R87612 Low grade squamous intraepithelial lesion on cytologic smear of cervix (LGSIL): Secondary | ICD-10-CM | POA: Insufficient documentation

## 2017-09-16 DIAGNOSIS — Z975 Presence of (intrauterine) contraceptive device: Secondary | ICD-10-CM | POA: Insufficient documentation

## 2017-12-29 ENCOUNTER — Ambulatory Visit: Payer: Self-pay | Admitting: Family Medicine

## 2018-03-04 ENCOUNTER — Emergency Department (HOSPITAL_BASED_OUTPATIENT_CLINIC_OR_DEPARTMENT_OTHER)
Admission: EM | Admit: 2018-03-04 | Discharge: 2018-03-04 | Disposition: A | Discharge status: Home / Self Care | Payer: Managed Care, Other (non HMO) | Attending: Emergency Medicine | Admitting: Emergency Medicine

## 2018-03-04 ENCOUNTER — Other Ambulatory Visit: Payer: Self-pay

## 2018-03-04 ENCOUNTER — Encounter (HOSPITAL_BASED_OUTPATIENT_CLINIC_OR_DEPARTMENT_OTHER): Payer: Self-pay | Admitting: Emergency Medicine

## 2018-03-04 DIAGNOSIS — R197 Diarrhea, unspecified: Secondary | ICD-10-CM | POA: Diagnosis not present

## 2018-03-04 DIAGNOSIS — Z87891 Personal history of nicotine dependence: Secondary | ICD-10-CM | POA: Insufficient documentation

## 2018-03-04 DIAGNOSIS — R112 Nausea with vomiting, unspecified: Secondary | ICD-10-CM | POA: Insufficient documentation

## 2018-03-04 LAB — CBC
HCT: 45.1 % (ref 36.0–46.0)
Hemoglobin: 14.5 g/dL (ref 12.0–15.0)
MCH: 29.3 pg (ref 26.0–34.0)
MCHC: 32.2 g/dL (ref 30.0–36.0)
MCV: 91.1 fL (ref 80.0–100.0)
NRBC: 0 % (ref 0.0–0.2)
PLATELETS: 237 10*3/uL (ref 150–400)
RBC: 4.95 MIL/uL (ref 3.87–5.11)
RDW: 11.7 % (ref 11.5–15.5)
WBC: 14.6 10*3/uL — AB (ref 4.0–10.5)

## 2018-03-04 LAB — COMPREHENSIVE METABOLIC PANEL
ALK PHOS: 50 U/L (ref 38–126)
ALT: 15 U/L (ref 0–44)
ANION GAP: 11 (ref 5–15)
AST: 29 U/L (ref 15–41)
Albumin: 4.8 g/dL (ref 3.5–5.0)
BUN: 17 mg/dL (ref 6–20)
CALCIUM: 9.2 mg/dL (ref 8.9–10.3)
CO2: 20 mmol/L — ABNORMAL LOW (ref 22–32)
CREATININE: 0.82 mg/dL (ref 0.44–1.00)
Chloride: 103 mmol/L (ref 98–111)
Glucose, Bld: 115 mg/dL — ABNORMAL HIGH (ref 70–99)
Potassium: 3.7 mmol/L (ref 3.5–5.1)
Sodium: 134 mmol/L — ABNORMAL LOW (ref 135–145)
Total Bilirubin: 1.4 mg/dL — ABNORMAL HIGH (ref 0.3–1.2)
Total Protein: 8.8 g/dL — ABNORMAL HIGH (ref 6.5–8.1)

## 2018-03-04 LAB — PREGNANCY, URINE: PREG TEST UR: NEGATIVE

## 2018-03-04 LAB — LIPASE, BLOOD: LIPASE: 30 U/L (ref 11–51)

## 2018-03-04 MED ORDER — ONDANSETRON HCL 4 MG/2ML IJ SOLN
4.0000 mg | Freq: Once | INTRAMUSCULAR | Status: AC
  Administered 2018-03-04: 4 mg via INTRAVENOUS

## 2018-03-04 MED ORDER — SODIUM CHLORIDE 0.9 % IV SOLN
8.0000 mg | Freq: Once | INTRAVENOUS | Status: DC
  Filled 2018-03-04: qty 4

## 2018-03-04 MED ORDER — ONDANSETRON HCL 4 MG/2ML IJ SOLN
INTRAMUSCULAR | Status: AC
  Filled 2018-03-04: qty 2

## 2018-03-04 MED ORDER — ONDANSETRON 4 MG PO TBDP: 4.0000 mg | ORAL_TABLET | Freq: Three times a day (TID) | ORAL | 0 refills | Status: DC | PRN

## 2018-03-04 MED ORDER — DICYCLOMINE HCL 20 MG PO TABS: 20.0000 mg | ORAL_TABLET | Freq: Three times a day (TID) | ORAL | 0 refills | Status: DC | PRN

## 2018-03-04 MED ORDER — SODIUM CHLORIDE 0.9 % IV BOLUS
1000.0000 mL | Freq: Once | INTRAVENOUS | Status: AC
  Administered 2018-03-04: 1000 mL via INTRAVENOUS

## 2018-03-04 MED FILL — ONDANSETRON ODT 4 MG TABLET: 4 | 2 days supply | Qty: 8 | Fill #0

## 2018-03-04 MED FILL — DICYCLOMINE 20 MG TABLET: 20 | 3 days supply | Qty: 10 | Fill #0

## 2018-03-04 NOTE — ED Notes (Signed)
ED Provider at bedside. 

## 2018-03-04 NOTE — ED Triage Notes (Signed)
Reports upper abdominal pain since last night with vomiting and diarrhea.

## 2018-03-04 NOTE — ED Provider Notes (Addendum)
[place] EMERGENCY DEPARTMENT Provider Note   CSN: [id number] Arrival date & time: 03/04/18  0941     History   Chief Complaint Chief Complaint  Patient presents with  . Abdominal Pain    HPI Katherine Mendez is a 27 y.o. female.  27 year old female who presents with nausea, vomiting, and diarrhea.  Night she began having vomiting associated with upper abdominal pain followed by diarrhea.  No blood.  She has not been able to keep liquids down.  She denies any fevers or URI symptoms.  No sick contacts.  No fevers.  No urinary symptoms or vaginal bleeding/discharge.  The history is provided by the patient.    Past Medical History:  Diagnosis Date  . Medical history non-contributory     Patient Active Problem List   Diagnosis Date Noted  . Active labor 12/30/2014  . Pregnancy 12/30/2014  . Pregnant and not yet delivered 11/30/2014    Past Surgical History:  Procedure Laterality Date  . CESAREAN SECTION N/A 12/30/2014   Procedure: CESAREAN SECTION;  Surgeon: Jerelyn Charles, MD;  Location: [facility];  Service: Obstetrics;  Laterality: N/A;  Twin A delivered vaginally, Twin B delivered via stat c-section  . NO PAST SURGERIES    . VAGINAL DELIVERY N/A 12/30/2014   Procedure: VAGINAL DELIVERY;  Surgeon: Jerelyn Charles, MD;  Location: [facility];  Service: Obstetrics;  Laterality: N/A;  Twin A delivered vaginally, Twin B delivered via stat c-section     OB History    Gravida  1   Para  1   Term  0   Preterm  1   AB  0   Living  2     SAB  0   TAB  0   Ectopic  0   Multiple  1   Live Births  2            Home Medications    Prior to Admission medications   Medication Sig Start Date End Date Taking? Authorizing Provider  dicyclomine (BENTYL) 20 MG tablet Take 1 tablet (20 mg total) by mouth 3 (three) times daily as needed for spasms. 03/04/18   Kirandeep Fariss, Wenda Overland, MD  levonorgestrel (MIRENA, 52 MG,) 20 MCG/24HR IUD Mirena 20 mcg/24 hours (5  yrs) 52 mg intrauterine device    [provider]  ondansetron (ZOFRAN ODT) 4 MG disintegrating tablet Take 1 tablet (4 mg total) by mouth every 8 (eight) hours as needed for nausea or vomiting. 03/04/18   Katrin Grabel, Wenda Overland, MD    Family History Family History  Problem Relation Age of Onset  . Depression Mother   . Hypertension Maternal Grandfather   . Hypertension Paternal Grandmother     Social History Social History   Tobacco Use  . Smoking status: Former Research scientist (life sciences)  . Smokeless tobacco: Never Used  Substance Use Topics  . Alcohol use: No  . Drug use: Yes    Types: Marijuana    Comment: none with pregnancy     Allergies   Patient has no known allergies.   Review of Systems Review of Systems All other systems reviewed and are negative except that which was mentioned in HPI  Physical Exam Updated Vital Signs BP (!) 100/54 (BP Location: Left Arm)   Pulse 74   Temp 98 F (36.7 C) (Oral)   Resp 18   Ht 4\' 11"  (1.499 m)   Wt 53.1 kg   LMP 02/02/2018 (Approximate)   SpO2 100%   BMI  23.63 kg/m   Physical Exam Vitals signs and nursing note reviewed.  Constitutional:      Appearance: She is well-developed.     Comments: Actively vomiting  HENT:     Head: Normocephalic and atraumatic.  Eyes:     Conjunctiva/sclera: Conjunctivae normal.  Neck:     Musculoskeletal: Neck supple.  Cardiovascular:     Rate and Rhythm: Normal rate and regular rhythm.     Heart sounds: Normal heart sounds. No murmur.  Pulmonary:     Effort: Pulmonary effort is normal.     Breath sounds: Normal breath sounds.  Abdominal:     General: Bowel sounds are normal. There is no distension.     Palpations: Abdomen is soft.     Tenderness: There is abdominal tenderness in the right upper quadrant, epigastric area and left upper quadrant.  Skin:    General: Skin is warm and dry.  Neurological:     Mental Status: She is alert and oriented to person, place, and time.     Comments:  Fluent speech  Psychiatric:        Judgment: Judgment normal.      ED Treatments / Results  Labs (all labs ordered are listed, but only abnormal results are displayed) Labs Reviewed  COMPREHENSIVE METABOLIC PANEL - Abnormal; Notable for the following components:      Result Value   Sodium 134 (*)    CO2 20 (*)    Glucose, Bld 115 (*)    Total Protein 8.8 (*)    Total Bilirubin 1.4 (*)    All other components within normal limits  CBC - Abnormal; Notable for the following components:   WBC 14.6 (*)    All other components within normal limits  LIPASE, BLOOD  PREGNANCY, URINE    EKG None  Radiology No results found.  Procedures Procedures (including critical care time)  Medications Ordered in ED Medications  ondansetron (ZOFRAN) injection 4 mg (4 mg Intravenous Given 03/04/18 1014)  sodium chloride 0.9 % bolus 1,000 mL (0 mLs Intravenous Stopped 03/04/18 1132)     Initial Impression / Assessment and Plan / ED Course  I have reviewed the triage vital signs and the nursing notes.  Pertinent labs that were available during my care of the patient were reviewed by me and considered in my medical decision making (see chart for details).    Pt actively vomiting initially. After zofran, much improved. Mild upper abd tenderness, no lower abd pain. VS reassuring. Labs show chronic leukocytosis similar to previous, normal LFTs and lipase. Pregnancy test negative. After fluids, pt well appearing on reassessment and has been tolerating fluids. Feels comfortable going home. Have discussed supportive measures and reviewed return precautions.  Final Clinical Impressions(s) / ED Diagnoses   Final diagnoses:  Nausea vomiting and diarrhea    ED Discharge Orders         Ordered    ondansetron (ZOFRAN ODT) 4 MG disintegrating tablet  Every 8 hours PRN     03/04/18 1241    dicyclomine (BENTYL) 20 MG tablet  3 times daily PRN     03/04/18 1241           Isabel Ardila, Wenda Overland, MD 03/04/18 Colwell, Wenda Overland, MD 03/04/18 1244

## 2020-04-04 ENCOUNTER — Telehealth: Payer: Self-pay | Admitting: *Deleted

## 2020-04-04 NOTE — Telephone Encounter (Signed)
Received a referral from [facility] for the patient to be seen for LGSIL. Several messages left at the office via fax and message for a copy o the patient's pap smear. Couple fax request sent also for pap smear. No response from referring office

## 2020-07-16 ENCOUNTER — Encounter (HOSPITAL_BASED_OUTPATIENT_CLINIC_OR_DEPARTMENT_OTHER): Payer: Self-pay

## 2020-07-16 ENCOUNTER — Emergency Department (HOSPITAL_BASED_OUTPATIENT_CLINIC_OR_DEPARTMENT_OTHER)
Admission: EM | Admit: 2020-07-16 | Discharge: 2020-07-16 | Disposition: A | Discharge status: Home / Self Care | Payer: Medicaid Other | Attending: Emergency Medicine | Admitting: Emergency Medicine

## 2020-07-16 ENCOUNTER — Other Ambulatory Visit: Payer: Self-pay

## 2020-07-16 DIAGNOSIS — Z87891 Personal history of nicotine dependence: Secondary | ICD-10-CM | POA: Diagnosis not present

## 2020-07-16 DIAGNOSIS — J029 Acute pharyngitis, unspecified: Secondary | ICD-10-CM | POA: Diagnosis present

## 2020-07-16 DIAGNOSIS — J020 Streptococcal pharyngitis: Secondary | ICD-10-CM | POA: Diagnosis not present

## 2020-07-16 DIAGNOSIS — J302 Other seasonal allergic rhinitis: Secondary | ICD-10-CM | POA: Diagnosis not present

## 2020-07-16 LAB — MONONUCLEOSIS SCREEN: Mono Screen: NEGATIVE

## 2020-07-16 LAB — GROUP A STREP BY PCR: Group A Strep by PCR: NOT DETECTED

## 2020-07-16 MED ORDER — IBUPROFEN 400 MG PO TABS
600.0000 mg | ORAL_TABLET | Freq: Once | ORAL | Status: AC
  Administered 2020-07-16: 600 mg via ORAL
  Filled 2020-07-16: qty 1

## 2020-07-16 NOTE — Discharge Instructions (Addendum)
Start a daily allergy medicine such as Zyrtec or Claritin.  Tylenol and ibuprofen for pain related to the throat.  Follow-up with primary care if the throat is not improving, possible need for specialty consultation.

## 2020-07-16 NOTE — ED Provider Notes (Signed)
[place] EMERGENCY DEPARTMENT Provider Note   CSN: [id number] Arrival date & time: 07/16/20  1559     History No chief complaint on file.   Katherine Mendez is a 30 y.o. female.  Sore throat for over a week.  Patient sings a lot and has seasonal allergies.  She is tried Benadryl with minimal relief.  No fevers chills.  No significant other symptoms.  Tolerating p.o.  No difficulty breathing.  No history of similar.  No sick contacts or exposures.        Past Medical History:  Diagnosis Date  . Medical history non-contributory     Patient Active Problem List   Diagnosis Date Noted  . Active labor 12/30/2014  . Pregnancy 12/30/2014  . Pregnant and not yet delivered 11/30/2014    Past Surgical History:  Procedure Laterality Date  . CESAREAN SECTION N/A 12/30/2014   Procedure: CESAREAN SECTION;  Surgeon: Jerelyn Charles, MD;  Location: [facility];  Service: Obstetrics;  Laterality: N/A;  Twin A delivered vaginally, Twin B delivered via stat c-section  . NO PAST SURGERIES    . VAGINAL DELIVERY N/A 12/30/2014   Procedure: VAGINAL DELIVERY;  Surgeon: Jerelyn Charles, MD;  Location: [facility];  Service: Obstetrics;  Laterality: N/A;  Twin A delivered vaginally, Twin B delivered via stat c-section     OB History    Gravida  1   Para  1   Term  0   Preterm  1   AB  0   Living  2     SAB  0   IAB  0   Ectopic  0   Multiple  1   Live Births  2           Family History  Problem Relation Age of Onset  . Depression Mother   . Hypertension Maternal Grandfather   . Hypertension Paternal Grandmother     Social History   Tobacco Use  . Smoking status: Former Research scientist (life sciences)  . Smokeless tobacco: Never Used  Vaping Use  . Vaping Use: Never used  Substance Use Topics  . Alcohol use: No  . Drug use: Yes    Types: Marijuana    Comment: weekly    Home Medications Prior to Admission medications   Medication Sig Start Date End Date Taking? Authorizing  Provider  levonorgestrel (MIRENA) 20 MCG/24HR IUD Mirena 20 mcg/24 hours (5 yrs) 52 mg intrauterine device   Yes [provider]  dicyclomine (BENTYL) 20 MG tablet Take 1 tablet (20 mg total) by mouth 3 (three) times daily as needed for spasms. 03/04/18   Little, Wenda Overland, MD  ondansetron (ZOFRAN ODT) 4 MG disintegrating tablet Take 1 tablet (4 mg total) by mouth every 8 (eight) hours as needed for nausea or vomiting. 03/04/18   Little, Wenda Overland, MD    Allergies    Patient has no known allergies.  Review of Systems   Review of Systems  Constitutional: Negative for chills and fever.  HENT: Positive for sore throat and voice change. Negative for congestion and rhinorrhea.   Respiratory: Negative for cough and shortness of breath.   Cardiovascular: Negative for chest pain and palpitations.  Gastrointestinal: Negative for diarrhea, nausea and vomiting.  Genitourinary: Negative for difficulty urinating and dysuria.  Musculoskeletal: Negative for arthralgias and back pain.  Skin: Negative for rash and wound.  Neurological: Negative for light-headedness and headaches.    Physical Exam Updated Vital Signs BP 107/77 (BP Location: Left Arm)  Pulse 63   Temp 98.3 F (36.8 C) (Oral)   Resp 18   Ht 4\' 11"  (1.499 m)   Wt 56.7 kg   SpO2 100%   BMI 25.25 kg/m   Physical Exam Vitals and nursing note reviewed. Exam conducted with a chaperone present.  Constitutional:      General: She is not in acute distress.    Appearance: Normal appearance.  HENT:     Head: Normocephalic and atraumatic.     Nose: No rhinorrhea.     Mouth/Throat:     Mouth: Mucous membranes are moist.     Pharynx: Oropharynx is clear. Posterior oropharyngeal erythema present. No oropharyngeal exudate.  Eyes:     General:        Right eye: No discharge.        Left eye: No discharge.     Conjunctiva/sclera: Conjunctivae normal.  Cardiovascular:     Rate and Rhythm: Normal rate and regular  rhythm.  Pulmonary:     Effort: Pulmonary effort is normal. No respiratory distress.     Breath sounds: No stridor.  Abdominal:     General: Abdomen is flat. There is no distension.     Palpations: Abdomen is soft.  Musculoskeletal:        General: No tenderness or signs of injury.  Skin:    General: Skin is warm and dry.  Neurological:     General: No focal deficit present.     Mental Status: She is alert. Mental status is at baseline.     Motor: No weakness.  Psychiatric:        Mood and Affect: Mood normal.        Behavior: Behavior normal.     ED Results / Procedures / Treatments   Labs (all labs ordered are listed, but only abnormal results are displayed) Labs Reviewed  GROUP A STREP BY PCR  MONONUCLEOSIS SCREEN    EKG None  Radiology No results found.  Procedures Procedures   Medications Ordered in ED Medications  ibuprofen (ADVIL) tablet 600 mg (600 mg Oral Given 07/16/20 1653)    ED Course  I have reviewed the triage vital signs and the nursing notes.  Pertinent labs & imaging results that were available during my care of the patient were reviewed by me and considered in my medical decision making (see chart for details).    MDM Rules/Calculators/A&P                          Strep throat versus other viral versus allergic pharyngitis.  Will start on Claritin or Zyrtec daily as she has allergies and Benadryl helps with symptoms.  Will rule out infectious things with Epstein-Barr testing as well as strep testing.  Otherwise supportive care.  Outpatient follow-up.  Strep screen negative.  Outpatient follow-up supportive care recommended Final Clinical Impression(s) / ED Diagnoses Final diagnoses:  Sore throat    Rx / DC Orders ED Discharge Orders    None       Breck Coons, MD 07/16/20 [phone]

## 2020-07-16 NOTE — ED Triage Notes (Signed)
Scratchy throat, hoarseness, has to strain her voice to talk x 2-3 months.

## 2021-01-11 ENCOUNTER — Emergency Department (HOSPITAL_BASED_OUTPATIENT_CLINIC_OR_DEPARTMENT_OTHER)
Admission: EM | Admit: 2021-01-11 | Discharge: 2021-01-11 | Disposition: A | Discharge status: Home / Self Care | Payer: Medicaid Other | Attending: Student | Admitting: Student

## 2021-01-11 ENCOUNTER — Encounter (HOSPITAL_BASED_OUTPATIENT_CLINIC_OR_DEPARTMENT_OTHER): Payer: Self-pay | Admitting: Emergency Medicine

## 2021-01-11 ENCOUNTER — Other Ambulatory Visit: Payer: Self-pay

## 2021-01-11 DIAGNOSIS — Z87891 Personal history of nicotine dependence: Secondary | ICD-10-CM | POA: Insufficient documentation

## 2021-01-11 DIAGNOSIS — J069 Acute upper respiratory infection, unspecified: Secondary | ICD-10-CM | POA: Insufficient documentation

## 2021-01-11 DIAGNOSIS — Z28310 Unvaccinated for covid-19: Secondary | ICD-10-CM | POA: Diagnosis not present

## 2021-01-11 DIAGNOSIS — Z20822 Contact with and (suspected) exposure to covid-19: Secondary | ICD-10-CM | POA: Diagnosis not present

## 2021-01-11 DIAGNOSIS — J029 Acute pharyngitis, unspecified: Secondary | ICD-10-CM | POA: Diagnosis present

## 2021-01-11 LAB — RESP PANEL BY RT-PCR (FLU A&B, COVID) ARPGX2
Influenza A by PCR: NEGATIVE
Influenza B by PCR: NEGATIVE
SARS Coronavirus 2 by RT PCR: NEGATIVE

## 2021-01-11 NOTE — ED Provider Notes (Signed)
[place] EMERGENCY DEPARTMENT Provider Note   CSN: [id number] Arrival date & time: 01/11/21  1034     History Chief Complaint  Patient presents with   Cough   Sore Throat    Katherine Mendez is a 30 y.o. female.  30 year old female with no past medical history presents to the ED with a chief complaint of a day, raspy voice, overall fatigue that has been ongoing for the past 5 days.  She does have 2 sick children at home at this time.  Ports when she went to work, she noted her voice to become raspy, she later lost her voice.  This improved this morning while she woke up fine.  Also endorses a sore throat, describes this as some pain with swallowing however is tolerating her secretions.  He did not receive any COVID-19 or influenza vaccination this season.  She has been drinking herbal tea without any improvement in her symptoms.  She denies any fever, chest pain, shortness of breath.     The history is provided by the patient.  Cough Associated symptoms: sore throat   Associated symptoms: no chest pain, no chills, no fever and no rhinorrhea   Sore Throat Pertinent negatives include no chest pain and no abdominal pain.      Past Medical History:  Diagnosis Date   Medical history non-contributory     Patient Active Problem List   Diagnosis Date Noted   Active labor 12/30/2014   Pregnancy 12/30/2014   Pregnant and not yet delivered 11/30/2014    Past Surgical History:  Procedure Laterality Date   CESAREAN SECTION N/A 12/30/2014   Procedure: CESAREAN SECTION;  Surgeon: Jerelyn Charles, MD;  Location: [facility];  Service: Obstetrics;  Laterality: N/A;  Twin A delivered vaginally, Twin B delivered via stat c-section   NO PAST SURGERIES     VAGINAL DELIVERY N/A 12/30/2014   Procedure: VAGINAL DELIVERY;  Surgeon: Jerelyn Charles, MD;  Location: [facility];  Service: Obstetrics;  Laterality: N/A;  Twin A delivered vaginally, Twin B delivered via stat c-section     OB  History     Gravida  1   Para  1   Term  0   Preterm  1   AB  0   Living  2      SAB  0   IAB  0   Ectopic  0   Multiple  1   Live Births  2           Family History  Problem Relation Age of Onset   Depression Mother    Hypertension Maternal Grandfather    Hypertension Paternal Grandmother     Social History   Tobacco Use   Smoking status: Former   Smokeless tobacco: Never  Scientific laboratory technician Use: Never used  Substance Use Topics   Alcohol use: No   Drug use: Yes    Types: Marijuana    Comment: weekly    Home Medications Prior to Admission medications   Medication Sig Start Date End Date Taking? Authorizing Provider  dicyclomine (BENTYL) 20 MG tablet Take 1 tablet (20 mg total) by mouth 3 (three) times daily as needed for spasms. 03/04/18   Little, Wenda Overland, MD  levonorgestrel (MIRENA) 20 MCG/24HR IUD Mirena 20 mcg/24 hours (5 yrs) 52 mg intrauterine device    [provider]  ondansetron (ZOFRAN ODT) 4 MG disintegrating tablet Take 1 tablet (4 mg total) by mouth every 8 (eight)  hours as needed for nausea or vomiting. 03/04/18   Little, Wenda Overland, MD    Allergies    Patient has no known allergies.  Review of Systems   Review of Systems  Constitutional:  Negative for chills and fever.  HENT:  Positive for sore throat and voice change. Negative for rhinorrhea.   Respiratory:  Positive for cough.   Cardiovascular:  Negative for chest pain.  Gastrointestinal:  Negative for abdominal pain.  Genitourinary:  Negative for flank pain.   Physical Exam Updated Vital Signs BP 114/72 (BP Location: Left Arm)   Pulse (!) 56   Temp 98.3 F (36.8 C) (Oral)   Resp 14   Ht 4\' 11"  (1.499 m)   Wt 56.7 kg   LMP  (Approximate) Comment: "since August"  SpO2 100%   BMI 25.25 kg/m   Physical Exam Vitals and nursing note reviewed.  Constitutional:      General: She is not in acute distress.    Appearance: She is well-developed.  HENT:      Head: Normocephalic and atraumatic.     Mouth/Throat:     Mouth: Mucous membranes are moist.     Pharynx: No oropharyngeal exudate.  Eyes:     Pupils: Pupils are equal, round, and reactive to light.  Cardiovascular:     Rate and Rhythm: Regular rhythm.     Heart sounds: Normal heart sounds.  Pulmonary:     Effort: Pulmonary effort is normal. No respiratory distress.     Breath sounds: Normal breath sounds.  Abdominal:     General: Bowel sounds are normal. There is no distension.     Palpations: Abdomen is soft.     Tenderness: There is no abdominal tenderness.  Musculoskeletal:        General: No tenderness or deformity.     Cervical back: Normal range of motion.     Right lower leg: No edema.     Left lower leg: No edema.  Skin:    General: Skin is warm and dry.  Neurological:     Mental Status: She is alert and oriented to person, place, and time.    ED Results / Procedures / Treatments   Labs (all labs ordered are listed, but only abnormal results are displayed) Labs Reviewed  RESP PANEL BY RT-PCR (FLU A&B, COVID) [id number]    EKG None  Radiology No results found.  Procedures Procedures   Medications Ordered in ED Medications - No data to display  ED Course  I have reviewed the triage vital signs and the nursing notes.  Pertinent labs & imaging results that were available during my care of the patient were reviewed by me and considered in my medical decision making (see chart for details).    MDM Rules/Calculators/A&P  Patient presents to the ED with a chief complaint of raspy voice, overall body aches.  Does have sick children at home, reports no improvement with herbal tea.  On arrival her vitals are within normal limits, there is no hypoxia, no tachycardia or fever.  Exam is benign with lungs clear to auscultation, oropharynx without any erythema, no tonsillar exudate noted.  We discussed symptomatic treatment with over-the-counter medication, she is  agreeable to plan treatment this time.  Patient is stable for discharge.  Portions of this note were generated with [employer]. Dictation errors may occur despite best attempts at proofreading.  Final Clinical Impression(s) / ED Diagnoses Final diagnoses:  Viral URI with cough  Rx / DC Orders ED Discharge Orders     None        Janeece Fitting, PA-C 01/11/21 1557    Drenda Freeze, MD 01/11/21 2312

## 2021-01-11 NOTE — ED Triage Notes (Signed)
Pt POV reports cough, raspy voice starting yesterday. Children have been sick. Denies fever.

## 2021-01-11 NOTE — Discharge Instructions (Addendum)
You tested negative for influenza A, influenza B, and COVID-19 today.  We discussed symptomatic treatment at home.  You may purchase some Chloraseptic spray to help with your sore throat, in addition to Robitussin.  Experience any worsening symptoms please return to the emergency department.

## 2021-05-14 ENCOUNTER — Other Ambulatory Visit: Payer: Self-pay | Admitting: Physician Assistant

## 2021-05-14 DIAGNOSIS — D259 Leiomyoma of uterus, unspecified: Secondary | ICD-10-CM

## 2022-10-13 ENCOUNTER — Emergency Department (HOSPITAL_BASED_OUTPATIENT_CLINIC_OR_DEPARTMENT_OTHER): Payer: Medicaid Other

## 2022-10-13 ENCOUNTER — Encounter (HOSPITAL_BASED_OUTPATIENT_CLINIC_OR_DEPARTMENT_OTHER): Payer: Self-pay | Admitting: Emergency Medicine

## 2022-10-13 ENCOUNTER — Other Ambulatory Visit: Payer: Self-pay

## 2022-10-13 DIAGNOSIS — X58XXXA Exposure to other specified factors, initial encounter: Secondary | ICD-10-CM | POA: Diagnosis not present

## 2022-10-13 DIAGNOSIS — S29012A Strain of muscle and tendon of back wall of thorax, initial encounter: Secondary | ICD-10-CM | POA: Diagnosis not present

## 2022-10-13 DIAGNOSIS — S3992XA Unspecified injury of lower back, initial encounter: Secondary | ICD-10-CM | POA: Diagnosis present

## 2022-10-13 LAB — URINALYSIS, ROUTINE W REFLEX MICROSCOPIC
Bilirubin Urine: NEGATIVE
Glucose, UA: NEGATIVE mg/dL
Hgb urine dipstick: NEGATIVE
Ketones, ur: NEGATIVE mg/dL
Leukocytes,Ua: NEGATIVE
Nitrite: NEGATIVE
Protein, ur: NEGATIVE mg/dL
Specific Gravity, Urine: >=1.03 (ref 1.005–1.030)
pH: 5.5 (ref 5.0–8.0)

## 2022-10-13 LAB — PREGNANCY, URINE: Preg Test, Ur: NEGATIVE
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
## 2022-10-14 ENCOUNTER — Emergency Department (HOSPITAL_BASED_OUTPATIENT_CLINIC_OR_DEPARTMENT_OTHER)
Admission: EM | Admit: 2022-10-14 | Discharge: 2022-10-14 | Disposition: A | Discharge status: Home / Self Care | Payer: Medicaid Other | Attending: Emergency Medicine | Admitting: Emergency Medicine

## 2022-10-14 ENCOUNTER — Emergency Department (HOSPITAL_BASED_OUTPATIENT_CLINIC_OR_DEPARTMENT_OTHER): Payer: Medicaid Other

## 2022-10-14 ENCOUNTER — Encounter (HOSPITAL_BASED_OUTPATIENT_CLINIC_OR_DEPARTMENT_OTHER): Payer: Self-pay

## 2022-10-14 ENCOUNTER — Other Ambulatory Visit: Payer: Self-pay

## 2022-10-14 DIAGNOSIS — S29019A Strain of muscle and tendon of unspecified wall of thorax, initial encounter: Secondary | ICD-10-CM

## 2022-10-14 LAB — CBC WITH DIFFERENTIAL/PLATELET
Abs Immature Granulocytes: 0.03 10*3/uL (ref 0.00–0.07)
Basophils Absolute: 0 10*3/uL (ref 0.0–0.1)
Basophils Relative: 0 %
Eosinophils Absolute: 0.1 10*3/uL (ref 0.0–0.5)
Eosinophils Relative: 1 %
HCT: 36.2 % (ref 36.0–46.0)
Hemoglobin: 12.2 g/dL (ref 12.0–15.0)
Immature Granulocytes: 0 %
Lymphocytes Relative: 32 %
Lymphs Abs: 3.1 10*3/uL (ref 0.7–4.0)
MCH: 30 pg (ref 26.0–34.0)
MCHC: 33.7 g/dL (ref 30.0–36.0)
MCV: 89.2 fL (ref 80.0–100.0)
Monocytes Absolute: 0.7 10*3/uL (ref 0.1–1.0)
Monocytes Relative: 8 %
Neutro Abs: 5.7 10*3/uL (ref 1.7–7.7)
Neutrophils Relative %: 59 %
Platelets: 232 10*3/uL (ref 150–400)
RBC: 4.06 MIL/uL (ref 3.87–5.11)
RDW: 11.8 % (ref 11.5–15.5)
WBC: 9.6 10*3/uL (ref 4.0–10.5)
nRBC: 0 % (ref 0.0–0.2)

## 2022-10-14 LAB — COMPREHENSIVE METABOLIC PANEL
ALT: 11 U/L (ref 0–44)
AST: 17 U/L (ref 15–41)
Albumin: 4.1 g/dL (ref 3.5–5.0)
Alkaline Phosphatase: 45 U/L (ref 38–126)
Anion gap: 10 (ref 5–15)
BUN: 16 mg/dL (ref 6–20)
CO2: 25 mmol/L (ref 22–32)
Calcium: 9.1 mg/dL (ref 8.9–10.3)
Chloride: 103 mmol/L (ref 98–111)
Creatinine, Ser: 0.84 mg/dL (ref 0.44–1.00)
GFR, Estimated: >60 mL/min (ref >60)
Glucose, Bld: 92 mg/dL (ref 70–99)
Potassium: 3.9 mmol/L (ref 3.5–5.1)
Sodium: 138 mmol/L (ref 135–145)
Total Bilirubin: 0.4 mg/dL (ref 0.3–1.2)
Total Protein: 7.9 g/dL (ref 6.5–8.1)

## 2022-10-14 LAB — D-DIMER, QUANTITATIVE: D-Dimer, Quant: 1.03 ug/mL-FEU — ABNORMAL HIGH (ref 0.00–0.50)

## 2022-10-14 MED ORDER — KETOROLAC TROMETHAMINE 30 MG/ML IJ SOLN
15.0000 mg | Freq: Once | INTRAMUSCULAR | Status: AC
  Administered 2022-10-14: 15 mg via INTRAVENOUS
  Filled 2022-10-14: qty 1

## 2022-10-14 MED ORDER — CYCLOBENZAPRINE HCL 5 MG PO TABS: 5.0000 mg | ORAL_TABLET | Freq: Two times a day (BID) | ORAL | 0 refills | Status: DC | PRN

## 2022-10-14 MED ORDER — IOHEXOL 350 MG/ML SOLN
75.0000 mL | Freq: Once | INTRAVENOUS | Status: AC | PRN
  Administered 2022-10-14: 75 mL via INTRAVENOUS

## 2022-10-14 NOTE — ED Notes (Signed)
Patient transported to CT 

## 2022-10-14 NOTE — ED Provider Notes (Signed)
[place] [facility] DE[facility]MENT AT [facility] Provider Note   CSN: [id number] Arrival date & time: 10/13/22  1916     History  Chief Complaint  Patient presents with   Back Pain    Katherine Mendez is a 32 y.o. female.  The history is provided by the patient.  Back Pain Katherine Mendez is a 32 y.o. female who presents to the Emergency Department complaining of back pain.  She presents to the emergency department for evaluation of left-sided thoracic back pain that started on Friday.  She describes it as a gas-like pain that is worse with breathing.  It is better when she lays on her side.  She feels like it is difficult to breathe.  She has tried Tylenol, Advil, gas pills without relief.  She also has some movements that exacerbate the pain such as rotation of the torso.  No associated fever, cough, shortness of breath.  She does report mild abdominal discomfort with diarrhea.  No nausea or vomiting.  She has a history of irregular cycle and has not had a cycle since May.  She does have an IUD.  No lower extremity swelling or pain.  No known medical problems.      Home Medications Prior to Admission medications   Medication Sig Start Date End Date Taking? Authorizing Provider  cyclobenzaprine (FLEXERIL) 5 MG tablet Take 1 tablet (5 mg total) by mouth 2 (two) times daily as needed for muscle spasms. 10/14/22  Yes Tilden Fossa, MD  dicyclomine (BENTYL) 20 MG tablet Take 1 tablet (20 mg total) by mouth 3 (three) times daily as needed for spasms. 03/04/18   Little, Ambrose Finland, MD  levonorgestrel (MIRENA) 20 MCG/24HR IUD Mirena 20 mcg/24 hours (5 yrs) 52 mg intrauterine device    [provider]  ondansetron (ZOFRAN ODT) 4 MG disintegrating tablet Take 1 tablet (4 mg total) by mouth every 8 (eight) hours as needed for nausea or vomiting. 03/04/18   Little, Ambrose Finland, MD      Allergies    Patient has no known allergies.    Review of Systems   Review of  Systems  Musculoskeletal:  Positive for back pain.  All other systems reviewed and are negative.   Physical Exam Updated Vital Signs BP (!) 117/98   Pulse (!) 56   Temp 98.5 F (36.9 C) (Oral)   Resp 19   Ht 4\' 11"  (1.499 m)   Wt 56.7 kg   LMP 07/27/2022   SpO2 100%   BMI 25.25 kg/m  Physical Exam Vitals and nursing note reviewed.  Constitutional:      Appearance: She is well-developed.  HENT:     Head: Normocephalic and atraumatic.  Cardiovascular:     Rate and Rhythm: Normal rate and regular rhythm.     Heart sounds: No murmur heard. Pulmonary:     Effort: Pulmonary effort is normal. No respiratory distress.     Breath sounds: Normal breath sounds.  Chest:     Chest wall: No tenderness.  Abdominal:     Palpations: Abdomen is soft.     Tenderness: There is no abdominal tenderness. There is no guarding or rebound.  Musculoskeletal:        General: No swelling or tenderness.  Skin:    General: Skin is warm and dry.  Neurological:     Mental Status: She is alert and oriented to person, place, and time.     Comments: 5 out of 5 strength  in all 4 extremities  Psychiatric:        Behavior: Behavior normal.     ED Results / Procedures / Treatments   Labs (all labs ordered are listed, but only abnormal results are displayed) Labs Reviewed  URINALYSIS, ROUTINE W REFLEX MICROSCOPIC - Abnormal; Notable for the following components:      Result Value   APPearance HAZY (*)    All other components within normal limits  D-DIMER, QUANTITATIVE - Abnormal; Notable for the following components:   D-Dimer, Quant 1.03 (*)    All other components within normal limits  PREGNANCY, URINE  COMPREHENSIVE METABOLIC PANEL  CBC WITH DIFFERENTIAL/PLATELET    EKG None  Radiology CT Angio Chest PE W/Cm &/Or Wo Cm  Result Date: 10/14/2022 CLINICAL DATA:  Back pain EXAM: CT ANGIOGRAPHY CHEST WITH CONTRAST TECHNIQUE: Multidetector CT imaging of the chest was performed using the  standard protocol during bolus administration of intravenous contrast. Multiplanar CT image reconstructions and MIPs were obtained to evaluate the vascular anatomy. RADIATION DOSE REDUCTION: This exam was performed according to the departmental dose-optimization program which includes automated exposure control, adjustment of the mA and/or kV according to patient size and/or use of iterative reconstruction technique. CONTRAST:  75mL OMNIPAQUE IOHEXOL 350 MG/ML SOLN COMPARISON:  None Available. FINDINGS: Cardiovascular: Satisfactory opacification of the pulmonary arteries to the segmental level. No evidence of pulmonary embolism. Normal heart size. No pericardial effusion. Nonaneurysmal aorta Mediastinum/Nodes: No enlarged mediastinal, hilar, or axillary lymph nodes. Thyroid gland, trachea, and esophagus demonstrate no significant findings. Lungs/Pleura: Lungs are clear. No pleural effusion or pneumothorax. Upper Abdomen: No acute abnormality. Musculoskeletal: No chest wall abnormality. No acute or significant osseous findings. Review of the MIP images confirms the above findings. IMPRESSION: 1. Negative for acute pulmonary embolus or aortic dissection. 2. Negative for acute airspace disease. Electronically Signed   By: Jasmine Pang M.D.   On: 10/14/2022 02:32   DG Thoracic Spine 2 View  Result Date: 10/13/2022 CLINICAL DATA:  Mid back pain EXAM: THORACIC SPINE 2 VIEWS COMPARISON:  None Available. FINDINGS: Minimal scoliosis. Vertebral body heights are normal. The disc spaces are patent IMPRESSION: Minimal scoliosis. Electronically Signed   By: Jasmine Pang M.D.   On: 10/13/2022 20:51    Procedures Procedures    Medications Ordered in ED Medications  ketorolac (TORADOL) 30 MG/ML injection 15 mg (15 mg Intravenous Given 10/14/22 0106)  iohexol (OMNIPAQUE) 350 MG/ML injection 75 mL (75 mLs Intravenous Contrast Given 10/14/22 0224)    ED Course/ Medical Decision Making/ A&P                              Medical Decision Making Amount and/or Complexity of Data Reviewed Labs: ordered. Radiology: ordered.  Risk Prescription drug management.   Patient here for evaluation of left-sided thoracic back pain.  No overlying rash or lesion.  Pain is not reproducible on examination.  She does have a pleuritic component to this pain.  She does have an IUD, otherwise is low risk for DVT/PE.  There is no stigmata of DVT on examination.  A D-dimer was obtained, which was elevated and CTA was obtained.  CTA is negative for pneumonia or PE.  Labs are otherwise unremarkable with no significant anemia or electrolyte disturbance.  Current examination is not consistent with acute cord compression, zoster.  Chest x-ray is negative for acute infiltrate or pneumothorax-images personally reviewed and interpreted, agree with radiologist interpretation.  She  was treated with Toradol in the emergency department with improvement in her symptoms.  Discussed with patient home care for musculoskeletal back pain.  Will prescribe as needed muscle relaxer.  Discussed OTC analgesics.  Discussed outpatient follow-up and return precautions.        Final Clinical Impression(s) / ED Diagnoses Final diagnoses:  Acute thoracic myofascial strain, initial encounter    Rx / DC Orders ED Discharge Orders          Ordered    cyclobenzaprine (FLEXERIL) 5 MG tablet  2 times daily PRN        10/14/22 0243              Tilden Fossa, MD 10/14/22 [phone]

## 2023-01-20 ENCOUNTER — Other Ambulatory Visit: Payer: Self-pay

## 2023-01-20 ENCOUNTER — Emergency Department (HOSPITAL_COMMUNITY): Payer: Medicaid Other

## 2023-01-20 ENCOUNTER — Emergency Department (HOSPITAL_COMMUNITY)
Admission: EM | Admit: 2023-01-20 | Discharge: 2023-01-20 | Disposition: A | Discharge status: Home / Self Care | Payer: Medicaid Other | Attending: Emergency Medicine | Admitting: Emergency Medicine

## 2023-01-20 ENCOUNTER — Encounter (HOSPITAL_COMMUNITY): Payer: Self-pay | Admitting: Emergency Medicine

## 2023-01-20 DIAGNOSIS — R1032 Left lower quadrant pain: Secondary | ICD-10-CM

## 2023-01-20 DIAGNOSIS — K529 Noninfective gastroenteritis and colitis, unspecified: Secondary | ICD-10-CM | POA: Insufficient documentation

## 2023-01-20 LAB — LIPASE, BLOOD: Lipase: 30 U/L (ref 11–51)

## 2023-01-20 LAB — URINALYSIS, ROUTINE W REFLEX MICROSCOPIC
Bilirubin Urine: NEGATIVE
Glucose, UA: NEGATIVE mg/dL
Hgb urine dipstick: NEGATIVE
Ketones, ur: 5 mg/dL — AB
Leukocytes,Ua: NEGATIVE
Nitrite: NEGATIVE
Protein, ur: NEGATIVE mg/dL
Specific Gravity, Urine: 1.026 (ref 1.005–1.030)
pH: 7 (ref 5.0–8.0)

## 2023-01-20 LAB — COMPREHENSIVE METABOLIC PANEL
ALT: 14 U/L (ref 0–44)
AST: 24 U/L (ref 15–41)
Albumin: 4.3 g/dL (ref 3.5–5.0)
Alkaline Phosphatase: 48 U/L (ref 38–126)
Anion gap: 12 (ref 5–15)
BUN: 10 mg/dL (ref 6–20)
CO2: 22 mmol/L (ref 22–32)
Calcium: 9.5 mg/dL (ref 8.9–10.3)
Chloride: 104 mmol/L (ref 98–111)
Creatinine, Ser: 0.81 mg/dL (ref 0.44–1.00)
GFR, Estimated: >60 mL/min (ref >60)
Glucose, Bld: 106 mg/dL — ABNORMAL HIGH (ref 70–99)
Potassium: 3.5 mmol/L (ref 3.5–5.1)
Sodium: 138 mmol/L (ref 135–145)
Total Bilirubin: 0.9 mg/dL (ref <1.2)
Total Protein: 7.9 g/dL (ref 6.5–8.1)

## 2023-01-20 LAB — CBC
HCT: 40.3 % (ref 36.0–46.0)
Hemoglobin: 13.4 g/dL (ref 12.0–15.0)
MCH: 30.2 pg (ref 26.0–34.0)
MCHC: 33.3 g/dL (ref 30.0–36.0)
MCV: 90.8 fL (ref 80.0–100.0)
Platelets: 259 10*3/uL (ref 150–400)
RBC: 4.44 MIL/uL (ref 3.87–5.11)
RDW: 11.8 % (ref 11.5–15.5)
WBC: 16.6 10*3/uL — ABNORMAL HIGH (ref 4.0–10.5)
nRBC: 0 % (ref 0.0–0.2)

## 2023-01-20 LAB — PREGNANCY, URINE: Preg Test, Ur: NEGATIVE

## 2023-01-20 MED ORDER — IOHEXOL 350 MG/ML SOLN
75.0000 mL | Freq: Once | INTRAVENOUS | Status: AC | PRN
  Administered 2023-01-20: 75 mL via INTRAVENOUS

## 2023-01-20 NOTE — ED Provider Notes (Signed)
[place] EMERGENCY DEPARTMENT AT [facility] Provider Note   CSN: [id number] Arrival date & time: 01/20/23  1236     History  Chief Complaint  Patient presents with   Abdominal Pain    Katherine Mendez is a 32 y.o. female.   Abdominal Pain Abdominal pain.  Left lower quadrant.  Crampy.  Began earlier today.  Continues.  No fevers.  No dysuria.  No new vaginal discharge.  Does have an IUD.  States it is due to be replaced.    Past Medical History:  Diagnosis Date   Medical history non-contributory     Home Medications Prior to Admission medications   Not on File      Allergies    Patient has no known allergies.    Review of Systems   Review of Systems  Gastrointestinal:  Positive for abdominal pain.    Physical Exam Updated Vital Signs BP 103/67   Pulse 89   Temp 98.6 F (37 C) (Oral)   Resp 17   SpO2 100%  Physical Exam Vitals and nursing note reviewed.  HENT:     Head: Normocephalic.  Cardiovascular:     Rate and Rhythm: Normal rate.  Abdominal:     Tenderness: There is abdominal tenderness.     Comments: LLQ tenderness.  No mass.  No hernia palpated.  Neurological:     Mental Status: She is alert.     ED Results / Procedures / Treatments   Labs (all labs ordered are listed, but only abnormal results are displayed) Labs Reviewed  COMPREHENSIVE METABOLIC PANEL - Abnormal; Notable for the following components:      Result Value   Glucose, Bld 106 (*)    All other components within normal limits  CBC - Abnormal; Notable for the following components:   WBC 16.6 (*)    All other components within normal limits  URINALYSIS, ROUTINE W REFLEX MICROSCOPIC - Abnormal; Notable for the following components:   APPearance HAZY (*)    Ketones, ur 5 (*)    All other components within normal limits  LIPASE, BLOOD  PREGNANCY, URINE  HCG, SERUM, QUALITATIVE    EKG None  Radiology CT ABDOMEN PELVIS W CONTRAST  Result Date:  01/20/2023 CLINICAL DATA:  Left lower quadrant abdominal pain. EXAM: CT ABDOMEN AND PELVIS WITH CONTRAST TECHNIQUE: Multidetector CT imaging of the abdomen and pelvis was performed using the standard protocol following bolus administration of intravenous contrast. RADIATION DOSE REDUCTION: This exam was performed according to the departmental dose-optimization program which includes automated exposure control, adjustment of the mA and/or kV according to patient size and/or use of iterative reconstruction technique. CONTRAST:  75mL OMNIPAQUE IOHEXOL 350 MG/ML SOLN COMPARISON:  None Available. FINDINGS: Lower chest: Clear lung bases. Hepatobiliary: No focal liver abnormality is seen. No gallstones, gallbladder wall thickening, or biliary dilatation. Pancreas: No ductal dilatation or inflammation. Spleen: Normal in size without focal abnormality. Adrenals/Urinary Tract: No adrenal nodule. No hydronephrosis, renal calculi or perinephric inflammation. No evidence of focal renal abnormality. Unremarkable urinary bladder. Stomach/Bowel: Detailed bowel assessment is limited in the absence of enteric contrast. Fluid within the stomach which is not abnormally distended. Scattered fluid-filled small bowel in the lower abdomen and pelvis. No definite associated wall thickening. Suggestion of mild small bowel hyperemia. Portions of normal appendix are visualized, no evidence of appendicitis. Mixed liquid and solid stool in the right colon, with formed stool in the more distal colon. No colonic wall thickening. No pericolonic edema.  Vascular/Lymphatic: No acute vascular findings. Normal caliber abdominal aorta. The portal and splenic veins are patent. No abdominopelvic adenopathy. Reproductive: IUD in the uterus which appears to be appropriately positioned. No adnexal mass. Other: No free air, free fluid, or intra-abdominal fluid collection. Musculoskeletal: There are no acute or suspicious osseous abnormalities. IMPRESSION:  Scattered fluid-filled small bowel in the lower abdomen and pelvis with suggestion of mild small bowel hyperemia. Findings may represent enteritis in the appropriate clinical setting. Electronically Signed   By: Narda Rutherford M.D.   On: 01/20/2023 21:10   US Pelvis Complete  Result Date: 01/20/2023 CLINICAL DATA:  Left-sided pelvic pain. EXAM: TRANSABDOMINAL AND TRANSVAGINAL ULTRASOUND OF PELVIS TECHNIQUE: Both transabdominal and transvaginal ultrasound examinations of the pelvis were performed. Transabdominal technique was performed for global imaging of the pelvis including uterus, ovaries, adnexal regions, and pelvic cul-de-sac. It was necessary to proceed with endovaginal exam following the transabdominal exam to visualize the endometrium and ovaries. COMPARISON:  None Available. FINDINGS: Uterus Measurements: 8.0 x 4.0 x 2.3 cm = volume: 38 mL. No fibroids or other mass visualized. Endometrium Thickness: 4 mm which is within normal limits. Intrauterine device is noted in endometrial space. Right ovary Measurements: 3.5 x 2.4 x 2.1 cm = volume: 10 mL. Normal appearance/no adnexal mass. Left ovary Measurements: 2.5 x 1.8 x 1.8 cm = volume: 4 mL. Normal appearance/no adnexal mass. Other findings No abnormal free fluid. IMPRESSION: Intrauterine device is noted. No definite abnormality seen in the pelvis. Electronically Signed   By: Lupita Raider M.D.   On: 01/20/2023 17:00   US Transvaginal Non-OB  Result Date: 01/20/2023 CLINICAL DATA:  Left-sided pelvic pain. EXAM: TRANSABDOMINAL AND TRANSVAGINAL ULTRASOUND OF PELVIS TECHNIQUE: Both transabdominal and transvaginal ultrasound examinations of the pelvis were performed. Transabdominal technique was performed for global imaging of the pelvis including uterus, ovaries, adnexal regions, and pelvic cul-de-sac. It was necessary to proceed with endovaginal exam following the transabdominal exam to visualize the endometrium and ovaries. COMPARISON:  None  Available. FINDINGS: Uterus Measurements: 8.0 x 4.0 x 2.3 cm = volume: 38 mL. No fibroids or other mass visualized. Endometrium Thickness: 4 mm which is within normal limits. Intrauterine device is noted in endometrial space. Right ovary Measurements: 3.5 x 2.4 x 2.1 cm = volume: 10 mL. Normal appearance/no adnexal mass. Left ovary Measurements: 2.5 x 1.8 x 1.8 cm = volume: 4 mL. Normal appearance/no adnexal mass. Other findings No abnormal free fluid. IMPRESSION: Intrauterine device is noted. No definite abnormality seen in the pelvis. Electronically Signed   By: Lupita Raider M.D.   On: 01/20/2023 17:00    Procedures Procedures    Medications Ordered in ED Medications  iohexol (OMNIPAQUE) 350 MG/ML injection 75 mL (75 mLs Intravenous Contrast Given 01/20/23 2046)    ED Course/ Medical Decision Making/ A&P                                 Medical Decision Making Amount and/or Complexity of Data Reviewed Labs: ordered. Radiology: ordered.  Risk Prescription drug management.   Patient with left lower quadrant/suprapubic pain.  Began this morning.  Somewhat crampy.  Ultrasound reassuring.  No vaginal discharge.  Urinalysis still pending as is pregnancy test.  White count is elevated.  If not pregnant will get CT scan.  Urinalysis reassuring.  Not pregnant.  CT scan shows potential enteritis.  Feeling better after treatment.  Eager to go home.  Will discharge.        Final Clinical Impression(s) / ED Diagnoses Final diagnoses:  Left lower quadrant abdominal pain  Enteritis    Rx / DC Orders ED Discharge Orders     None         Benjiman Core, MD 01/20/23 2318

## 2023-01-20 NOTE — ED Provider Triage Note (Signed)
Emergency Medicine Provider Triage Evaluation Note  Katherine Mendez , a 32 y.o. female  was evaluated in triage.  Pt complains of pelvic pain.  Review of Systems  Positive: Pelvic pain Negative: UTI symptoms, kidney stone history  Physical Exam  BP 106/79   Pulse 67   Temp 98.2 F (36.8 C) (Oral)   Resp 18   SpO2 100%  Gen:   Awake, no distress, Resp:  Normal effort  MSK:   Moves extremities without difficulty  Other:    Medical Decision Making  Medically screening exam initiated at 2:05 PM.  Appropriate orders placed.  Katherine Mendez was informed that the remainder of the evaluation will be completed by another provider, this initial triage assessment does not replace that evaluation, and the importance of remaining in the ED until their evaluation is complete.  Plans to get basic labs, ultrasound pelvis.  Patient will await complaints of evaluation.   Derwood Kaplan, MD 01/23/23 1345

## 2023-01-20 NOTE — ED Triage Notes (Signed)
Pt BIB [facility] from [facility] with reports of LLQ and LUQ abdominal pain that started at 0800 this morning. No fevers or vomiting.

## 2023-01-20 NOTE — Discharge Instructions (Signed)
Your small bowel may be a little inflamed.  Try and keep yourself hydrated.  Follow-up with your doctor as needed.

## 2023-11-23 ENCOUNTER — Other Ambulatory Visit: Payer: Self-pay

## 2023-11-23 ENCOUNTER — Ambulatory Visit (INDEPENDENT_AMBULATORY_CARE_PROVIDER_SITE_OTHER)

## 2023-11-23 ENCOUNTER — Ambulatory Visit: Admission: EM | Admit: 2023-11-23 | Discharge: 2023-11-23 | Disposition: A | Discharge status: Home / Self Care

## 2023-11-23 ENCOUNTER — Encounter: Payer: Self-pay | Admitting: *Deleted

## 2023-11-23 DIAGNOSIS — S92352A Displaced fracture of fifth metatarsal bone, left foot, initial encounter for closed fracture: Secondary | ICD-10-CM

## 2023-11-23 NOTE — ED Triage Notes (Signed)
 Pt states yesterday she was stepping down off a step and rolled her left foot. Swelling noted to lateral edge of foot. Ambulated to room 1 with limp. She has tried using cold pack. 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

## 2023-11-23 NOTE — Discharge Instructions (Addendum)
 Wear the boot 24/7 Use the crutches for ambulation. Touch weight bearing is appropriate   Tylenol  and ibuprofen  for pain Elevate the leg when you can   Please follow up with orthopedics. They have walk-in hours ([facility]) or you can call to make an appointment Katherine Mendez)

## 2023-11-23 NOTE — ED Provider Notes (Signed)
[facility]    CSN: [id number] Arrival date & time: 11/23/23  1508      History   Chief Complaint Chief Complaint  Patient presents with   Foot Injury    HPI Katherine Mendez is a 33 y.o. female.  Here with left foot injury that occurred yesterday Stepped down and rolled her foot.  She is having swelling and pain mostly on the lateral edge. 8/10 pain  Has been icing  No prior injury known   Past Medical History:  Diagnosis Date   Medical history non-contributory     Patient Active Problem List   Diagnosis Date Noted   Active labor 12/30/2014   Pregnancy 12/30/2014   Pregnant and not yet delivered 11/30/2014    Past Surgical History:  Procedure Laterality Date   CESAREAN SECTION N/A 12/30/2014   Procedure: CESAREAN SECTION;  Surgeon: Jolene Gaskins, MD;  Location: [facility];  Service: Obstetrics;  Laterality: N/A;  Twin A delivered vaginally, Twin B delivered via stat c-section   NO PAST SURGERIES     VAGINAL DELIVERY N/A 12/30/2014   Procedure: VAGINAL DELIVERY;  Surgeon: Jolene Gaskins, MD;  Location: [facility];  Service: Obstetrics;  Laterality: N/A;  Twin A delivered vaginally, Twin B delivered via stat c-section    OB History     Gravida  1   Para  1   Term  0   Preterm  1   AB  0   Living  2      SAB  0   IAB  0   Ectopic  0   Multiple  1   Live Births  2            Home Medications    Prior to Admission medications   Medication Sig Start Date End Date Taking? Authorizing Provider  KURVELO 0.15-30 MG-MCG tablet Take 1 tablet by mouth daily. 10/23/23  Yes [provider]    Family History Family History  Problem Relation Age of Onset   Depression Mother    Hypertension Maternal Grandfather    Hypertension Paternal Grandmother     Social History Social History   Tobacco Use   Smoking status: Former   Smokeless tobacco: Never  Advertising account planner   Vaping status: Never Used  Substance Use Topics   Alcohol use: Yes     Comment: occasional   Drug use: Yes    Types: Marijuana    Comment: weekly     Allergies   Patient has no known allergies.   Review of Systems Review of Systems  As per HPI  Physical Exam Triage Vital Signs ED Triage Vitals  Encounter Vitals Group     BP 11/23/23 1526 111/74     Girls Systolic BP Percentile --      Girls Diastolic BP Percentile --      Boys Systolic BP Percentile --      Boys Diastolic BP Percentile --      Pulse Rate 11/23/23 1526 62     Resp 11/23/23 1526 18     Temp 11/23/23 1526 98.9 F (37.2 C)     Temp Source 11/23/23 1526 Oral     SpO2 11/23/23 1526 98 %     Weight --      Height --      Head Circumference --      Peak Flow --      Pain Score 11/23/23 1527 8     Pain Loc --  Pain Education --      Exclude from Growth Chart --    No data found.  Updated Vital Signs BP 111/74 (BP Location: Left Arm)   Pulse 62   Temp 98.9 F (37.2 C) (Oral)   Resp 18   LMP 11/20/2023   SpO2 98%   Breastfeeding No    Physical Exam Vitals and nursing note reviewed.  Constitutional:      General: She is not in acute distress. HENT:     Mouth/Throat:     Pharynx: Oropharynx is clear.  Cardiovascular:     Rate and Rhythm: Normal rate and regular rhythm.     Pulses: Normal pulses.  Pulmonary:     Effort: Pulmonary effort is normal.  Musculoskeletal:     Cervical back: Normal range of motion.     Comments: Left foot 1+ swelling laterally. Tender to touch. Distal sensation of toes intact.  Cap refill less than 2 seconds.  DP and PT pulses 2+.  There is mild bruising.  No other skin changes. Good ROM ankle and toes  Skin:    Capillary Refill: Capillary refill takes less than 2 seconds.  Neurological:     Mental Status: She is alert and oriented to person, place, and time.     UC Treatments / Results  Labs (all labs ordered are listed, but only abnormal results are displayed) Labs Reviewed - No data to display  EKG   Radiology DG  Foot Complete Left Result Date: 11/23/2023 CLINICAL DATA:  Status post trauma. EXAM: LEFT FOOT - COMPLETE 3+ VIEW COMPARISON:  None Available. FINDINGS: An acute, very mildly displaced fracture deformity is seen involving the midportion of the fifth left metatarsal. There is no evidence of dislocation. There is no evidence of arthropathy or other focal bone abnormality. Mild soft tissue swelling is seen along the previously noted fracture site. IMPRESSION: Acute fracture of the fifth left metatarsal. Electronically Signed   By: Suzen Dials M.D.   On: 11/23/2023 16:09    Procedures Procedures (including critical care time)  Medications Ordered in UC Medications - No data to display  Initial Impression / Assessment and Plan / UC Course  I have reviewed the triage vital signs and the nursing notes.  Pertinent labs & imaging results that were available during my care of the patient were reviewed by me and considered in my medical decision making (see chart for details).   Left foot xray with fifth metatarsal fracture.  Mildly displaced. Images independently reviewed by me, agree with radiology interpretation. Discussed with patient CAM boot and crutches are provided with instructions for proper use Pain control is discussed.  I did offer a couple tablets of narcotic medication for severe breakthrough pain, however patient politely declines. Information for orthopedic follow-up is provided. All questions answered A note for job is provided  Final Clinical Impressions(s) / UC Diagnoses   Final diagnoses:  Closed displaced fracture of fifth metatarsal bone of left foot, initial encounter     Discharge Instructions      Wear the boot 24/7 Use the crutches for ambulation. Touch weight bearing is appropriate   Tylenol  and ibuprofen  for pain Elevate the leg when you can   Please follow up with orthopedics. They have walk-in hours ([facility]) or you can call to make an appointment  Katherine Mendez)     ED Prescriptions   None    I have reviewed the PDMP during this encounter.   Katherine Mendez, [place] RIGGERS 11/23/23  1633  

## 2023-11-24 ENCOUNTER — Ambulatory Visit (INDEPENDENT_AMBULATORY_CARE_PROVIDER_SITE_OTHER): Admitting: Physician Assistant

## 2023-11-24 DIAGNOSIS — S92352B Displaced fracture of fifth metatarsal bone, left foot, initial encounter for open fracture: Secondary | ICD-10-CM | POA: Diagnosis not present

## 2023-11-25 ENCOUNTER — Encounter: Payer: Self-pay | Admitting: Physician Assistant

## 2023-11-25 NOTE — Progress Notes (Signed)
 Office Visit Note   Patient: Katherine Mendez           Date of Birth: 04/16/1990           MRN: [id number] Visit Date: 11/24/2023              Requested by: No referring provider defined for this encounter. PCP: System, Provider Not In  Chief Complaint  Patient presents with   Left Foot - Fracture    5th MT fracture      HPI: 33 y/o female who stained a foot injury when she stepped down and rolled her foot.  She was seen at [facility] urgent care and advised to follow up with [facility].  She is using crutches and has been placed in a fracture boot NWB.      Assessment & Plan: Visit Diagnoses:  1. Open fracture of fifth metatarsal bone of left foot, physeal involvement unspecified, initial encounter     Plan: RICE.  Limit all weight-bearing activity on the affected foot until follow up x ray.  Fracture boot for protection and support.    Follow-Up Instructions: Return in about 2 weeks (around 12/08/2023) for left foot x ray .   Ortho Exam  Patient is alert, oriented, no adenopathy, well-dressed, normal affect, normal respiratory effort. Mild edema with minimal ecchymosis lateral foot.  Tenderness over the midshaft fifth MT.  Palpable pedal pulse.      Imaging: X ray reviewed. An acute, very mildly displaced fracture deformity is seen involving the midportion of the fifth left metatarsal.  Labs: Lab Results  Component Value Date   REPTSTATUS 02/07/2009 FINAL 02/04/2009   GRAMSTAIN  02/04/2009    RARE WBC PRESENT, PREDOMINANTLY PMN NO SQUAMOUS EPITHELIAL CELLS SEEN NO ORGANISMS SEEN   CULT  02/04/2009    FEW STAPHYLOCOCCUS AUREUS Note: RIFAMPIN AND GENTAMICIN SHOULD NOT BE USED AS SINGLE DRUGS FOR TREATMENT OF STAPH INFECTIONS.   LABORGA STAPHYLOCOCCUS AUREUS 02/04/2009     Lab Results  Component Value Date   ALBUMIN 4.3 01/20/2023   ALBUMIN 4.1 10/14/2022   ALBUMIN 4.8 03/04/2018    No results found for: MG No results found for: VD25OH  No  results found for: PREALBUMIN    Latest Ref Rng & Units 01/20/2023    1:01 PM 10/14/2022    1:02 AM 03/04/2018   10:31 AM  CBC EXTENDED  WBC 4.0 - 10.5 K/uL 16.6  9.6  14.6   RBC 3.87 - 5.11 MIL/uL 4.44  4.06  4.95   Hemoglobin 12.0 - 15.0 g/dL 86.5  87.7  85.4   HCT 36.0 - 46.0 % 40.3  36.2  45.1   Platelets 150 - 400 K/uL 259  232  237   NEUT# 1.7 - 7.7 K/uL  5.7    Lymph# 0.7 - 4.0 K/uL  3.1       There is no height or weight on file to calculate BMI.  Orders:  No orders of the defined types were placed in this encounter.  No orders of the defined types were placed in this encounter.    Procedures: No procedures performed  Clinical Data: No additional findings.  ROS:  All other systems negative, except as noted in the HPI. Review of Systems  Objective: Vital Signs: LMP 11/20/2023   Specialty Comments:  No specialty comments available.  PMFS History: Patient Active Problem List   Diagnosis Date Noted   Active labor 12/30/2014   Pregnancy 12/30/2014   Pregnant and not  yet delivered 11/30/2014   Past Medical History:  Diagnosis Date   Medical history non-contributory     Family History  Problem Relation Age of Onset   Depression Mother    Hypertension Maternal Grandfather    Hypertension Paternal Grandmother     Past Surgical History:  Procedure Laterality Date   CESAREAN SECTION N/A 12/30/2014   Procedure: CESAREAN SECTION;  Surgeon: Jolene Gaskins, MD;  Location: [facility];  Service: Obstetrics;  Laterality: N/A;  Twin A delivered vaginally, Twin B delivered via stat c-section   NO PAST SURGERIES     VAGINAL DELIVERY N/A 12/30/2014   Procedure: VAGINAL DELIVERY;  Surgeon: Jolene Gaskins, MD;  Location: [facility];  Service: Obstetrics;  Laterality: N/A;  Twin A delivered vaginally, Twin B delivered via stat c-section   Social History   Occupational History   Not on file  Tobacco Use   Smoking status: Former   Smokeless tobacco: Never  Vaping Use    Vaping status: Never Used  Substance and Sexual Activity   Alcohol use: Yes    Comment: occasional   Drug use: Yes    Types: Marijuana    Comment: weekly   Sexual activity: Not on file

## 2023-12-08 ENCOUNTER — Ambulatory Visit: Admitting: Physician Assistant

## 2023-12-08 ENCOUNTER — Encounter: Payer: Self-pay | Admitting: Physician Assistant

## 2023-12-08 ENCOUNTER — Other Ambulatory Visit: Payer: Self-pay

## 2023-12-08 DIAGNOSIS — S92352B Displaced fracture of fifth metatarsal bone, left foot, initial encounter for open fracture: Secondary | ICD-10-CM | POA: Diagnosis not present

## 2023-12-08 NOTE — Progress Notes (Signed)
 Office Visit Note   Patient: Katherine Mendez           Date of Birth: 1990-12-16           MRN: [id number] Visit Date: 12/08/2023              Requested by: No referring provider defined for this encounter. PCP: System, Provider Not In  Chief Complaint  Patient presents with   Left Foot - Follow-up    5th MT fx       HPI: 33 y/o female who stained a foot injury when she stepped down and rolled her foot. She was seen at [facility] urgent care and advised to follow up with [facility]. She is using crutches and has been placed in a fracture boot NWB.  She has a minimally displaced midportion fifth metatarsal fracture  The plan was for RICE.  Limit all weight-bearing activity on the affected foot until follow up x ray.  Fracture boot for protection and support.   She states she is still afraid to put full weight on her foot even in the boot.  She feels like she is walking different and turning her foot out.     Assessment & Plan: Visit Diagnoses:  1. Open fracture of fifth metatarsal bone of left foot, physeal involvement unspecified, initial encounter     Plan: Weight bearing in Cam boot as tolerates.  She can start to wean into a comfortable shoe once she is fully weight bearing in the cam boot.  Sh was given a temporary Handicap placard so she can park closer at work.  RICE.  Tylenol  for pain.    Follow-Up Instructions: Return in about 3 weeks (around 12/29/2023).   Ortho Exam  Patient is alert, oriented, no adenopathy, well-dressed, normal affect, normal respiratory effort. No edema.  Slight tenderness to palpation over the mid shaft of the fifth MT.  No cellulitis and palpable DP pulse.      Imaging: New callus formation surrounding the mid shaft fifth MT fracture.  Acceptable alignment minimally displaced.    Labs: Lab Results  Component Value Date   REPTSTATUS 02/07/2009 FINAL 02/04/2009   GRAMSTAIN  02/04/2009    RARE WBC PRESENT, PREDOMINANTLY PMN NO SQUAMOUS  EPITHELIAL CELLS SEEN NO ORGANISMS SEEN   CULT  02/04/2009    FEW STAPHYLOCOCCUS AUREUS Note: RIFAMPIN AND GENTAMICIN SHOULD NOT BE USED AS SINGLE DRUGS FOR TREATMENT OF STAPH INFECTIONS.   LABORGA STAPHYLOCOCCUS AUREUS 02/04/2009     Lab Results  Component Value Date   ALBUMIN 4.3 01/20/2023   ALBUMIN 4.1 10/14/2022   ALBUMIN 4.8 03/04/2018    No results found for: MG No results found for: VD25OH  No results found for: PREALBUMIN    Latest Ref Rng & Units 01/20/2023    1:01 PM 10/14/2022    1:02 AM 03/04/2018   10:31 AM  CBC EXTENDED  WBC 4.0 - 10.5 K/uL 16.6  9.6  14.6   RBC 3.87 - 5.11 MIL/uL 4.44  4.06  4.95   Hemoglobin 12.0 - 15.0 g/dL 86.5  87.7  85.4   HCT 36.0 - 46.0 % 40.3  36.2  45.1   Platelets 150 - 400 K/uL 259  232  237   NEUT# 1.7 - 7.7 K/uL  5.7    Lymph# 0.7 - 4.0 K/uL  3.1       There is no height or weight on file to calculate BMI.  Orders:  Orders Placed This Encounter  Procedures   XR Foot 2 Views Left   No orders of the defined types were placed in this encounter.    Procedures: No procedures performed  Clinical Data: No additional findings.  ROS:  All other systems negative, except as noted in the HPI. Review of Systems  Objective: Vital Signs: LMP 11/20/2023   Specialty Comments:  No specialty comments available.  PMFS History: Patient Active Problem List   Diagnosis Date Noted   Active labor 12/30/2014   Pregnancy 12/30/2014   Pregnant and not yet delivered 11/30/2014   Past Medical History:  Diagnosis Date   Medical history non-contributory     Family History  Problem Relation Age of Onset   Depression Mother    Hypertension Maternal Grandfather    Hypertension Paternal Grandmother     Past Surgical History:  Procedure Laterality Date   CESAREAN SECTION N/A 12/30/2014   Procedure: CESAREAN SECTION;  Surgeon: Jolene Gaskins, MD;  Location: [facility];  Service: Obstetrics;  Laterality: N/A;  Twin A delivered  vaginally, Twin B delivered via stat c-section   NO PAST SURGERIES     VAGINAL DELIVERY N/A 12/30/2014   Procedure: VAGINAL DELIVERY;  Surgeon: Jolene Gaskins, MD;  Location: [facility];  Service: Obstetrics;  Laterality: N/A;  Twin A delivered vaginally, Twin B delivered via stat c-section   Social History   Occupational History   Not on file  Tobacco Use   Smoking status: Former   Smokeless tobacco: Never  Vaping Use   Vaping status: Never Used  Substance and Sexual Activity   Alcohol use: Yes    Comment: occasional   Drug use: Yes    Types: Marijuana    Comment: weekly   Sexual activity: Not on file

## 2023-12-10 ENCOUNTER — Encounter: Payer: Self-pay | Admitting: Emergency Medicine

## 2023-12-10 ENCOUNTER — Ambulatory Visit
Admission: EM | Admit: 2023-12-10 | Discharge: 2023-12-10 | Disposition: A | Discharge status: Home / Self Care | Attending: Nurse Practitioner | Admitting: Nurse Practitioner

## 2023-12-10 DIAGNOSIS — N883 Incompetence of cervix uteri: Secondary | ICD-10-CM | POA: Insufficient documentation

## 2023-12-10 DIAGNOSIS — R112 Nausea with vomiting, unspecified: Secondary | ICD-10-CM | POA: Diagnosis not present

## 2023-12-10 DIAGNOSIS — A0811 Acute gastroenteropathy due to Norwalk agent: Secondary | ICD-10-CM

## 2023-12-10 DIAGNOSIS — N760 Acute vaginitis: Secondary | ICD-10-CM | POA: Insufficient documentation

## 2023-12-10 DIAGNOSIS — O210 Mild hyperemesis gravidarum: Secondary | ICD-10-CM | POA: Insufficient documentation

## 2023-12-10 LAB — POCT URINE DIPSTICK
Bilirubin, UA: NEGATIVE
Blood, UA: NEGATIVE
Glucose, UA: NEGATIVE mg/dL
Ketones, POC UA: NEGATIVE mg/dL
Leukocytes, UA: NEGATIVE
Nitrite, UA: NEGATIVE
Protein Ur, POC: 100 mg/dL — AB
Spec Grav, UA: 1.02 (ref 1.010–1.025)
Urobilinogen, UA: 1 U/dL
pH, UA: 7.5 (ref 5.0–8.0)

## 2023-12-10 LAB — POCT URINE PREGNANCY: Preg Test, Ur: NEGATIVE

## 2023-12-10 MED ORDER — ONDANSETRON 8 MG PO TBDP: 8.0000 mg | ORAL_TABLET | Freq: Three times a day (TID) | ORAL | 0 refills | Status: AC | PRN

## 2023-12-10 MED ORDER — ONDANSETRON 4 MG PO TBDP
4.0000 mg | ORAL_TABLET | Freq: Once | ORAL | Status: AC
  Administered 2023-12-10: 4 mg via ORAL

## 2023-12-10 NOTE — Discharge Instructions (Addendum)
 You were seen today for nausea and vomiting, which is most likely caused by a stomach virus (such as norovirus). This type of illness usually gets better on its own in a few days. For the next 24 hours, stick with clear liquids like water, Pedialyte, broth, or clear sodas. Do not drink anything red, including red popsicles or red Jell-O, because it can make it hard to tell if you are vomiting blood. After 24 hours, you can start eating bland foods such as bananas, rice, applesauce, and toast. Avoid milk and dairy products, as well as fried, greasy, or spicy foods, until you are feeling better. You can slowly return to your normal diet as your symptoms improve. Go to the emergency room if you cannot keep down fluids, if vomiting does not stop, if you develop severe stomach pain, high fever, blood in your vomit, or signs of dehydration such as dizziness, fainting, or not urinating.

## 2023-12-10 NOTE — ED Triage Notes (Addendum)
 Pt reports nausea and emesis episodes that began this morning upon waking. 2 emesis episodes total, last episode 8:30am.  Pt has been able to keep down ginger ale this morning after episodes. Pt notes last meal was a lot of processed foods, which have upset her stomach in the past.  Also notes sore throat and hoarseness this morning. Not currently dizzy, but had episodes of dizziness after emesis episodes. Denies fevers, chills, nasal congestion, cough, diarrhea, and abdominal pain. No medications used for symptoms at home.

## 2023-12-10 NOTE — ED Provider Notes (Signed)
 Katherine Mendez    CSN: [id number] Arrival date & time: 12/10/23  0919      History   Chief Complaint Chief Complaint  Patient presents with   Emesis   Dizziness    HPI Katherine Mendez is a 33 y.o. female.   Discussed the use of AI scribe software for clinical note transcription with the patient, who gave verbal consent to proceed.   The patient presents with nausea and vomiting that started this morning upon waking. The patient experienced two episodes of vomiting but was able to keep down some ginger ale. Yesterday, the patient ate chicken and fries from [employer] that were cooked in an air fryer, as well as a hot dog. The patient currently reports general weakness and dry mouth. The patient denies fever, chills, body aches, diarrhea, headaches, or urinary symptoms. There have been no additional episodes of vomiting since the initial two episodes this morning.  The following sections of the patient's history were reviewed and updated as appropriate: allergies, current medications, past family history, past medical history, past social history, past surgical history, and problem list.     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   Contraception management 03/25/2013   Screen for STD (sexually transmitted disease) 03/25/2013   Irregular menses 12/02/2011    Past Surgical History:  Procedure Laterality Date   CESAREAN SECTION N/A 12/30/2014   Procedure: CESAREAN SECTION;  Surgeon: Jolene Gaskins, MD;  Location:  [facility];  Service: Obstetrics;  Laterality: N/A;  Twin A delivered vaginally, Twin B delivered via stat c-section   NO PAST SURGERIES     VAGINAL DELIVERY N/A 12/30/2014   Procedure: VAGINAL DELIVERY;  Surgeon: Jolene Gaskins, MD;  Location: [facility];  Service: Obstetrics;  Laterality: N/A;  Twin A delivered vaginally, Twin B delivered via stat c-section    OB History     Gravida  1   Para  1   Term  0   Preterm  1   AB  0   Living  2      SAB  0   IAB  0   Ectopic  0   Multiple  1   Live Births  2            Home Medications    Prior to Admission medications   Medication Sig Start Date End Date Taking? Authorizing Provider  KURVELO 0.15-30 MG-MCG tablet Take 1 tablet by mouth daily. 10/23/23  Yes [provider]  ondansetron  (ZOFRAN -ODT) 8 MG disintegrating tablet Take 1 tablet (8 mg total) by mouth every 8 (eight) hours as needed for nausea or vomiting. 12/10/23  Yes Iola Lukes, FNP    Family History Family History  Problem Relation Age of Onset   Depression Mother    Hypertension Maternal Grandfather    Hypertension Paternal Grandmother     Social History Social History   Tobacco Use   Smoking status: Former    Passive exposure: Past   Smokeless tobacco: Never  Vaping Use   Vaping status:  Never Used  Substance Use Topics   Alcohol use: Yes    Comment: occasional   Drug use: Yes    Types: Marijuana    Comment: weekly     Allergies   Patient has no known allergies.   Review of Systems Review of Systems  Constitutional:  Negative for chills and fever.  HENT: Negative.    Gastrointestinal:  Positive for nausea and vomiting. Negative for abdominal pain and diarrhea.  Genitourinary: Negative.   Neurological:  Positive for dizziness and weakness (generalized). Negative for headaches.  All other systems reviewed and are negative.    Physical Exam Triage Vital Signs ED Triage Vitals  Encounter Vitals Group     BP 12/10/23  1037 104/70     Girls Systolic BP Percentile --      Girls Diastolic BP Percentile --      Boys Systolic BP Percentile --      Boys Diastolic BP Percentile --      Pulse Rate 12/10/23 1037 60     Resp 12/10/23 1037 14     Temp 12/10/23 1037 (!) 97.5 F (36.4 C)     Temp Source 12/10/23 1037 Oral     SpO2 12/10/23 1037 98 %     Weight --      Height --      Head Circumference --      Peak Flow --      Pain Score 12/10/23 1038 6     Pain Loc --      Pain Education --      Exclude from Growth Chart --    No data found.  Updated Vital Signs BP 104/70 (BP Location: Left Arm)   Pulse 60   Temp (!) 97.5 F (36.4 C) (Oral)   Resp 14   LMP 11/20/2023   SpO2 98%   Visual Acuity Right Eye Distance:   Left Eye Distance:   Bilateral Distance:    Right Eye Near:   Left Eye Near:    Bilateral Near:     Physical Exam Vitals reviewed.  Constitutional:      General: She is awake. She is not in acute distress.    Appearance: Normal appearance. She is well-developed. She is not ill-appearing, toxic-appearing or diaphoretic.  HENT:     Head: Normocephalic.     Right Ear: Hearing normal.     Left Ear: Hearing normal.     Nose: Nose normal.     Mouth/Throat:     Mouth: Mucous membranes are moist.  Eyes:     General: Vision grossly intact.     Conjunctiva/sclera: Conjunctivae normal.  Cardiovascular:     Rate and Rhythm: Normal rate and regular rhythm.     Heart sounds: Normal heart sounds.  Pulmonary:     Effort: Pulmonary effort is normal.     Breath sounds: Normal breath sounds and air entry.  Abdominal:     General: Bowel sounds are normal. There is no distension.     Palpations: Abdomen is soft.     Tenderness: There is no abdominal tenderness.  Musculoskeletal:        General: Normal range of motion.     Cervical back: Normal range of motion and neck supple.  Skin:    General: Skin is warm and dry.  Neurological:     General: No focal deficit present.     Mental  Status: She is alert and oriented to person, place, and time.  Psychiatric:  Speech: Speech normal.        Behavior: Behavior is cooperative.      UC Treatments / Results  Labs (all labs ordered are listed, but only abnormal results are displayed) Labs Reviewed  POCT URINE DIPSTICK - Abnormal; Notable for the following components:      Result Value   Clarity, UA cloudy (*)    Protein Ur, POC =100 (*)    All other components within normal limits  POCT URINE PREGNANCY - Normal    EKG   Radiology No results found.  Procedures Procedures (including critical Mendez time)  Medications Ordered in UC Medications  ondansetron  (ZOFRAN -ODT) disintegrating tablet 4 mg (4 mg Oral Given 12/10/23 1046)    Initial Impression / Assessment and Plan / UC Course  I have reviewed the triage vital signs and the nursing notes.  Pertinent labs & imaging results that were available during my Mendez of the patient were reviewed by me and considered in my medical decision making (see chart for details).    The patient presents with acute onset of nausea and vomiting, most consistent with viral gastroenteritis, likely norovirus. Exam and history do not suggest a surgical abdomen or other emergent cause. Supportive Mendez was discussed, including clear liquids for the next 24 hours while avoiding red beverages, popsicles, or gelatin products. The patient may then advance to a bland diet as tolerated, starting with bananas, rice, applesauce, and toast, while avoiding dairy, fried, greasy, or spicy foods. Gradual return to a normal diet is appropriate as symptoms improve. Emergency precautions were reviewed, including inability to tolerate fluids, persistent vomiting, signs of dehydration, severe abdominal pain, bloody emesis, or high fever.  Today's evaluation has revealed no signs of a dangerous process. Discussed diagnosis with patient and/or guardian. Patient and/or guardian aware of their diagnosis,  possible red flag symptoms to watch out for and need for close follow up. Patient and/or guardian understands verbal and written discharge instructions. Patient and/or guardian comfortable with plan and disposition.  Patient and/or guardian has a clear mental status at this time, good insight into illness (after discussion and teaching) and has clear judgment to make decisions regarding their Mendez  Documentation was completed with the aid of voice recognition software. Transcription may contain typographical errors.   Final Clinical Impressions(s) / UC Diagnoses   Final diagnoses:  Nausea and vomiting, unspecified vomiting type  Norovirus     Discharge Instructions      You were seen today for nausea and vomiting, which is most likely caused by a stomach virus (such as norovirus). This type of illness usually gets better on its own in a few days. For the next 24 hours, stick with clear liquids like water, Pedialyte, broth, or clear sodas. Do not drink anything red, including red popsicles or red Jell-O, because it can make it hard to tell if you are vomiting blood. After 24 hours, you can start eating bland foods such as bananas, rice, applesauce, and toast. Avoid milk and dairy products, as well as fried, greasy, or spicy foods, until you are feeling better. You can slowly return to your normal diet as your symptoms improve. Go to the emergency room if you cannot keep down fluids, if vomiting does not stop, if you develop severe stomach pain, high fever, blood in your vomit, or signs of dehydration such as dizziness, fainting, or not urinating.     ED Prescriptions     Medication Sig Dispense Auth. Provider   ondansetron  (ZOFRAN -ODT) 8  MG disintegrating tablet Take 1 tablet (8 mg total) by mouth every 8 (eight) hours as needed for nausea or vomiting. 12 tablet Iola Lukes, FNP      PDMP not reviewed this encounter.   Iola Lukes, [place] 12/10/23 1232

## 2023-12-29 ENCOUNTER — Ambulatory Visit: Admitting: Physician Assistant

## 2023-12-29 ENCOUNTER — Encounter: Payer: Self-pay | Admitting: Physician Assistant

## 2023-12-29 ENCOUNTER — Other Ambulatory Visit: Payer: Self-pay

## 2023-12-29 DIAGNOSIS — S92352A Displaced fracture of fifth metatarsal bone, left foot, initial encounter for closed fracture: Secondary | ICD-10-CM

## 2023-12-29 DIAGNOSIS — M79672 Pain in left foot: Secondary | ICD-10-CM | POA: Diagnosis not present

## 2023-12-29 NOTE — Progress Notes (Addendum)
 Office Visit Note   Patient: Katherine Mendez           Date of Birth: 1990-06-07           MRN: [id number] Visit Date: 12/29/2023              Requested by: No referring provider defined for this encounter. PCP: System, Provider Not In  Chief Complaint  Patient presents with   Left Foot - Follow-up      HPI: 33 y/o female who stained a foot injury when she stepped down and rolled her foot. She was seen at [facility] urgent care and advised to follow up with [facility]. She is using crutches and has been placed in a fracture boot NWB.  She has a minimally displaced midportion fifth metatarsal fracture             The plan was for RICE.  Limit all weight-bearing activity on the affected foot until follow up x ray.  Fracture boot for protection and support.              She states she is still afraid to put full weight on her foot even in the boot.  She feels like she is walking different and turning her foot out.    She has been weight bearing as tolerates in the cam boot over the last week.  She has minimal pain and not much swelling.  She has walked some without the boot and tolerated this well for short periods of time.    Assessment & Plan: Visit Diagnoses:  1. Pain in left foot     Plan: WBAT in the cam boot may slowly wean into a comfortable stiff sole shoe.  Cam boot for crowded situations.  Elevation as needed for edema.  Follow-Up Instructions: Return in about 3 weeks (around 01/19/2024).   Ortho Exam  Patient is alert, oriented, no adenopathy, well-dressed, normal affect, normal respiratory effort. No edema or cellulitis.  Slight tenderness to palpation over the mid left fifth MT.  Full ankle ROM.  Palpable DP pulse.    Imaging: Minimally displaced left fifth mid shaft MT.  Good bone callus formation on AP, non visible fracture on lateral view.    Labs: Lab Results  Component Value Date   REPTSTATUS 02/07/2009 FINAL 02/04/2009   GRAMSTAIN  02/04/2009    RARE  WBC PRESENT, PREDOMINANTLY PMN NO SQUAMOUS EPITHELIAL CELLS SEEN NO ORGANISMS SEEN   CULT  02/04/2009    FEW STAPHYLOCOCCUS AUREUS Note: RIFAMPIN AND GENTAMICIN SHOULD NOT BE USED AS SINGLE DRUGS FOR TREATMENT OF STAPH INFECTIONS.   LABORGA STAPHYLOCOCCUS AUREUS 02/04/2009     Lab Results  Component Value Date   ALBUMIN 4.3 01/20/2023   ALBUMIN 4.1 10/14/2022   ALBUMIN 4.8 03/04/2018    No results found for: MG No results found for: VD25OH  No results found for: PREALBUMIN    Latest Ref Rng & Units 01/20/2023    1:01 PM 10/14/2022    1:02 AM 03/04/2018   10:31 AM  CBC EXTENDED  WBC 4.0 - 10.5 K/uL 16.6  9.6  14.6   RBC 3.87 - 5.11 MIL/uL 4.44  4.06  4.95   Hemoglobin 12.0 - 15.0 g/dL 86.5  87.7  85.4   HCT 36.0 - 46.0 % 40.3  36.2  45.1   Platelets 150 - 400 K/uL 259  232  237   NEUT# 1.7 - 7.7 K/uL  5.7    Lymph# 0.7 - 4.0  K/uL  3.1       There is no height or weight on file to calculate BMI.  Orders:  Orders Placed This Encounter  Procedures   XR Foot 2 Views Left   No orders of the defined types were placed in this encounter.    Procedures: No procedures performed  Clinical Data: No additional findings.  ROS:  All other systems negative, except as noted in the HPI. Review of Systems  Objective: Vital Signs: There were no vitals taken for this visit.  Specialty Comments:  No specialty comments available.  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   Contraception management 03/25/2013   Screen for STD (sexually transmitted disease) 03/25/2013    Irregular menses 12/02/2011   Past Medical History:  Diagnosis Date   Medical history non-contributory     Family History  Problem Relation Age of Onset   Depression Mother    Hypertension Maternal Grandfather    Hypertension Paternal Grandmother     Past Surgical History:  Procedure Laterality Date   CESAREAN SECTION N/A 12/30/2014   Procedure: CESAREAN SECTION;  Surgeon: Jolene Gaskins, MD;  Location: [facility];  Service: Obstetrics;  Laterality: N/A;  Twin A delivered vaginally, Twin B delivered via stat c-section   NO PAST SURGERIES     VAGINAL DELIVERY N/A 12/30/2014   Procedure: VAGINAL DELIVERY;  Surgeon: Jolene Gaskins, MD;  Location: [facility];  Service: Obstetrics;  Laterality: N/A;  Twin A delivered vaginally, Twin B delivered via stat c-section   Social History   Occupational History   Not on file  Tobacco Use   Smoking status: Former    Passive exposure: Past   Smokeless tobacco: Never  Vaping Use   Vaping status: Never Used  Substance and Sexual Activity   Alcohol use: Yes    Comment: occasional   Drug use: Yes    Types: Marijuana    Comment: weekly   Sexual activity: Not on file

## 2024-01-19 ENCOUNTER — Encounter: Payer: Self-pay | Admitting: Radiology

## 2024-01-20 ENCOUNTER — Encounter: Payer: Self-pay | Admitting: Physician Assistant

## 2024-01-20 ENCOUNTER — Other Ambulatory Visit: Payer: Self-pay

## 2024-01-20 ENCOUNTER — Ambulatory Visit: Admitting: Physician Assistant

## 2024-01-20 DIAGNOSIS — S92352A Displaced fracture of fifth metatarsal bone, left foot, initial encounter for closed fracture: Secondary | ICD-10-CM

## 2024-01-20 NOTE — Progress Notes (Signed)
 Office Visit Note   Patient: Katherine Mendez           Date of Birth: [birth year]           MRN: [id number] Visit Date: 01/20/2024              Requested by: No referring provider defined for this encounter. PCP: System, Provider Not In  Chief Complaint  Patient presents with   Left Foot - Follow-up      HPI: 33 y/o female with history of left fifth MT fracture that occurred 11/22/23.  She weaned out of the cam boot on 01/02/24.  She has tingling occasionally, but no pain and minimal edema depending on activity.    Assessment & Plan: Visit Diagnoses:  1. Displaced fracture of fifth metatarsal bone, left foot, initial encounter for closed fracture     Plan: Activity as tolerates.  Swelling can cause a temporary pins-and-needles sensation this sensation will likely dissipate in time.    Follow-Up Instructions: Return if symptoms worsen or fail to improve.   Ortho Exam  Patient is alert, oriented, no adenopathy, well-dressed, normal affect, normal respiratory effort. No tender to palpation mid shaft of the fifth MT on the left foot.  No edema or cellulitis.  Full active ROM ankle dorsiflexion, plantar flexion and inversion/eversion.      Imaging: mildly displaced fracture deformity is seen involving the midportion of the fifth left metatarsal with bone callus formation at the fracture site showing healing.  Labs: Lab Results  Component Value Date   REPTSTATUS 02/07/2009 FINAL 02/04/2009   GRAMSTAIN  02/04/2009    RARE WBC PRESENT, PREDOMINANTLY PMN NO SQUAMOUS EPITHELIAL CELLS SEEN NO ORGANISMS SEEN   CULT  02/04/2009    FEW STAPHYLOCOCCUS AUREUS Note: RIFAMPIN AND GENTAMICIN SHOULD NOT BE USED AS SINGLE DRUGS FOR TREATMENT OF STAPH INFECTIONS.   LABORGA STAPHYLOCOCCUS AUREUS 02/04/2009     Lab Results  Component Value Date   ALBUMIN 4.3 01/20/2023   ALBUMIN 4.1 10/14/2022   ALBUMIN 4.8 03/04/2018    No results found for: MG No results found for:  VD25OH  No results found for: PREALBUMIN    Latest Ref Rng & Units 01/20/2023    1:01 PM 10/14/2022    1:02 AM 03/04/2018   10:31 AM  CBC EXTENDED  WBC 4.0 - 10.5 K/uL 16.6  9.6  14.6   RBC 3.87 - 5.11 MIL/uL 4.44  4.06  4.95   Hemoglobin 12.0 - 15.0 g/dL 86.5  87.7  85.4   HCT 36.0 - 46.0 % 40.3  36.2  45.1   Platelets 150 - 400 K/uL 259  232  237   NEUT# 1.7 - 7.7 K/uL  5.7    Lymph# 0.7 - 4.0 K/uL  3.1       There is no height or weight on file to calculate BMI.  Orders:  Orders Placed This Encounter  Procedures   XR Foot Complete Left   No orders of the defined types were placed in this encounter.    Procedures: No procedures performed  Clinical Data: No additional findings.  ROS:  All other systems negative, except as noted in the HPI. Review of Systems  Objective: Vital Signs: There were no vitals taken for this visit.  Specialty Comments:  No specialty comments available.  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/01/2014   Contraception management 03/25/2013   Screen for STD (sexually transmitted disease) 03/25/2013   Irregular menses 12/02/2011   Past Medical History:  Diagnosis Date   Medical history non-contributory     Family History  Problem Relation Age of Onset   Depression Mother    Hypertension Maternal Grandfather    Hypertension Paternal Grandmother     Past Surgical History:  Procedure Laterality Date   CESAREAN SECTION N/A 12/30/2014   Procedure: CESAREAN SECTION;  Surgeon: Jolene Gaskins, MD;  Location: [facility];  Service: Obstetrics;  Laterality:  N/A;  Twin A delivered vaginally, Twin B delivered via stat c-section   NO PAST SURGERIES     VAGINAL DELIVERY N/A 12/30/2014   Procedure: VAGINAL DELIVERY;  Surgeon: Jolene Gaskins, MD;  Location: [facility];  Service: Obstetrics;  Laterality: N/A;  Twin A delivered vaginally, Twin B delivered via stat c-section   Social History   Occupational History   Not on file  Tobacco Use   Smoking status: Former    Passive exposure: Past   Smokeless tobacco: Never  Vaping Use   Vaping status: Never Used  Substance and Sexual Activity   Alcohol use: Yes    Comment: occasional   Drug use: Yes    Types: Marijuana    Comment: weekly   Sexual activity: Not on file
